# Patient Record
Sex: Female | Born: 1975 | Race: White | Hispanic: No | Marital: Married
Health system: Midwestern US, Community
[De-identification: ages and names within clinical notes are randomized; demographics above are authoritative.]

## PROBLEM LIST (undated history)

## (undated) DIAGNOSIS — N946 Dysmenorrhea, unspecified: Secondary | ICD-10-CM

## (undated) DIAGNOSIS — G8929 Other chronic pain: Secondary | ICD-10-CM

## (undated) DIAGNOSIS — M23305 Other meniscus derangements, unspecified medial meniscus, unspecified knee: Secondary | ICD-10-CM

## (undated) DIAGNOSIS — Z8639 Personal history of other endocrine, nutritional and metabolic disease: Secondary | ICD-10-CM

## (undated) DIAGNOSIS — L709 Acne, unspecified: Secondary | ICD-10-CM

## (undated) DIAGNOSIS — F419 Anxiety disorder, unspecified: Secondary | ICD-10-CM

## (undated) DIAGNOSIS — K8020 Calculus of gallbladder without cholecystitis without obstruction: Secondary | ICD-10-CM

## (undated) DIAGNOSIS — E785 Hyperlipidemia, unspecified: Secondary | ICD-10-CM

## (undated) DIAGNOSIS — I10 Essential (primary) hypertension: Secondary | ICD-10-CM

## (undated) DIAGNOSIS — Z9989 Dependence on other enabling machines and devices: Secondary | ICD-10-CM

## (undated) DIAGNOSIS — K219 Gastro-esophageal reflux disease without esophagitis: Secondary | ICD-10-CM

## (undated) DIAGNOSIS — E669 Obesity, unspecified: Secondary | ICD-10-CM

## (undated) DIAGNOSIS — E079 Disorder of thyroid, unspecified: Secondary | ICD-10-CM

## (undated) DIAGNOSIS — R7303 Prediabetes: Secondary | ICD-10-CM

## (undated) DIAGNOSIS — Z973 Presence of spectacles and contact lenses: Secondary | ICD-10-CM

## (undated) DIAGNOSIS — Z87898 Personal history of other specified conditions: Secondary | ICD-10-CM

## (undated) DIAGNOSIS — G4730 Sleep apnea, unspecified: Secondary | ICD-10-CM

## (undated) DIAGNOSIS — G4733 Obstructive sleep apnea (adult) (pediatric): Secondary | ICD-10-CM

## (undated) DIAGNOSIS — T7840XA Allergy, unspecified, initial encounter: Secondary | ICD-10-CM

## (undated) HISTORY — PX: SHOULDER ARTHROSCOPY: SHX128

## (undated) HISTORY — PX: CHOLECYSTECTOMY: SHX55

## (undated) HISTORY — DX: Sleep apnea, unspecified: G47.30

## (undated) HISTORY — DX: Anxiety disorder, unspecified: F41.9

## (undated) HISTORY — DX: Obesity, unspecified: E66.9

## (undated) HISTORY — PX: ELBOW SURGERY: SHX618

## (undated) HISTORY — DX: Essential (primary) hypertension: I10

## (undated) HISTORY — DX: Allergy, unspecified, initial encounter: T78.40XA

## (undated) HISTORY — DX: Obstructive sleep apnea (adult) (pediatric): G47.33

## (undated) HISTORY — DX: Hyperlipidemia, unspecified: E78.5

## (undated) HISTORY — DX: Gastro-esophageal reflux disease without esophagitis: K21.9

## (undated) HISTORY — PX: SHOULDER SURGERY: SHX246

## (undated) HISTORY — DX: Prediabetes: R73.03

## (undated) HISTORY — DX: Disorder of thyroid, unspecified: E07.9

## (undated) HISTORY — DX: Calculus of gallbladder without cholecystitis without obstruction: K80.20

## (undated) HISTORY — DX: Dependence on other enabling machines and devices: Z99.89

---

## 1999-09-17 HISTORY — PX: BILATERAL CARPAL TUNNEL RELEASE: SHX6508

## 2002-09-16 HISTORY — PX: CHOLECYSTECTOMY: SHX55

## 2005-08-20 ENCOUNTER — Encounter: Admission: RE | Admit: 2005-08-20 | Discharge: 2005-08-20 | Payer: Self-pay | Admitting: Family Medicine

## 2006-01-04 ENCOUNTER — Emergency Department (HOSPITAL_COMMUNITY): Admission: EM | Admit: 2006-01-04 | Discharge: 2006-01-04 | Payer: Self-pay | Admitting: Emergency Medicine

## 2006-03-06 ENCOUNTER — Emergency Department (HOSPITAL_COMMUNITY): Admission: EM | Admit: 2006-03-06 | Discharge: 2006-03-06 | Payer: Self-pay | Admitting: Emergency Medicine

## 2006-03-09 ENCOUNTER — Emergency Department (HOSPITAL_COMMUNITY): Admission: EM | Admit: 2006-03-09 | Discharge: 2006-03-09 | Payer: Self-pay | Admitting: Emergency Medicine

## 2007-09-16 IMAGING — CR DG CHEST 2V
2 series · 2 of 2 positions shown · non-contrast
Comparison: 03/06/2006

CLINICAL DATA: Chest pain

CHEST - 2 VIEW:

[w chest pa]
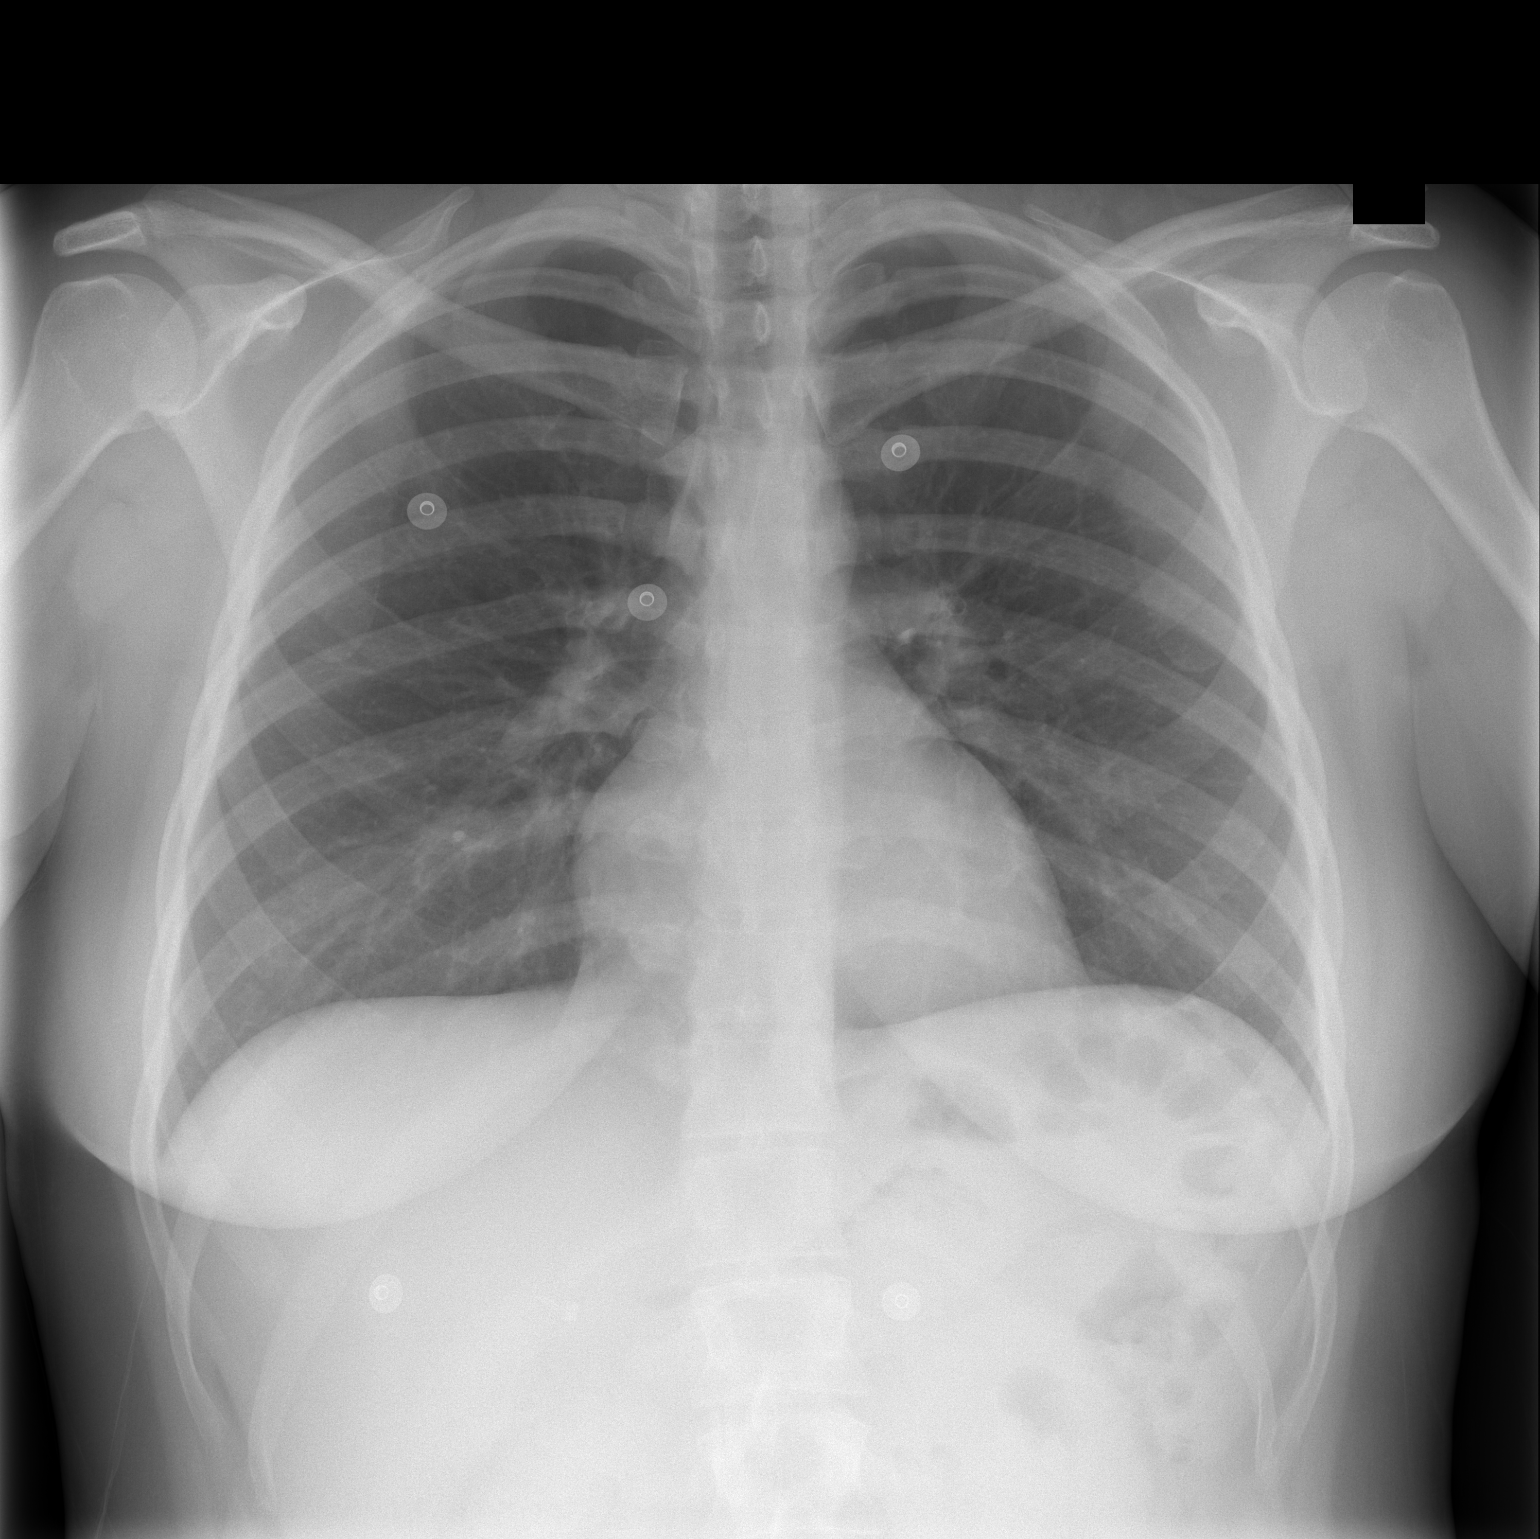

[w chest lat]
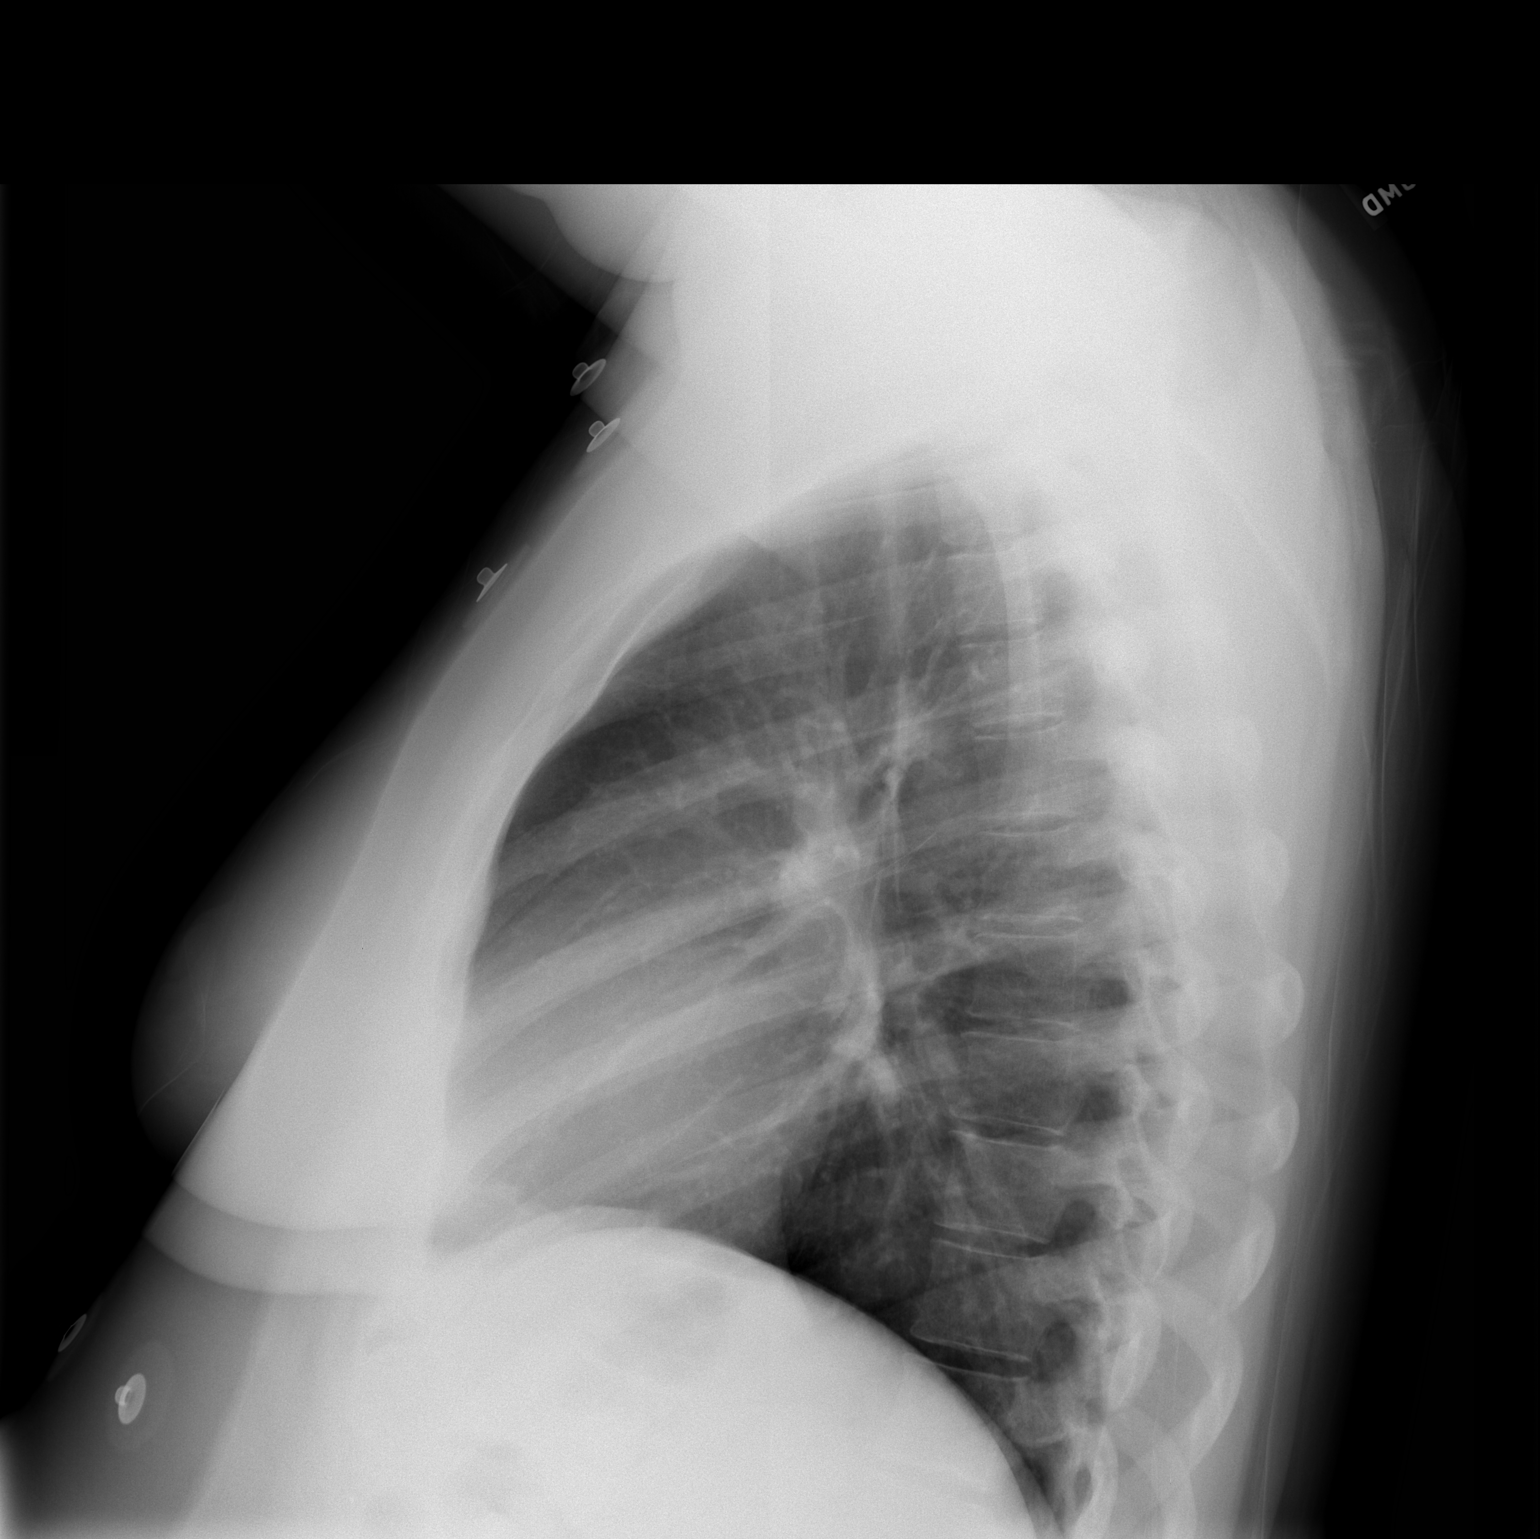

[2 of 2 positions shown; findings below may reference images not displayed]

FINDINGS: The heart size and mediastinal contours are within normal limits. 
Both lungs are clear.  The visualized skeletal structures are unremarkable.
IMPRESSION: No active cardiopulmonary disease

## 2011-09-17 HISTORY — PX: OTHER SURGICAL HISTORY: SHX169

## 2013-09-27 ENCOUNTER — Inpatient Hospital Stay: Payer: PRIVATE HEALTH INSURANCE

## 2013-09-27 LAB — HCG URINE, QL. - POC: Pregnancy test,urine (POC): NEGATIVE

## 2013-09-27 MED ADMIN — bupivacaine 0.25% -EPINEPHrine 1:200,000 (SENSORCAINE) 0.25 %-1:200,000 injection: @ 16:00:00 | NDC 00409174630

## 2013-09-27 MED ADMIN — oxyCODONE-acetaminophen (PERCOCET) 5-325 mg per tablet 1-2 tablet: ORAL | @ 18:00:00 | NDC 68084035511

## 2013-09-27 MED ADMIN — fentaNYL citrate (PF) injection 25 mcg: INTRAVENOUS | @ 16:00:00 | NDC 00641602701

## 2013-09-27 MED ADMIN — lactated ringers infusion: INTRAVENOUS | @ 14:00:00 | NDC 00409795309

## 2013-09-27 MED ADMIN — promethazine (PHENERGAN) injection 12.5 mg: INTRAMUSCULAR | @ 18:00:00 | NDC 00641092821

## 2013-09-27 MED ADMIN — ceFAZolin (ANCEF) 3 g in 0.9% NS 100 ml IVPB: INTRAVENOUS | @ 15:00:00 | NDC 09999051801

## 2013-09-27 MED ADMIN — famotidine (PEPCID) tablet 20 mg: ORAL | @ 14:00:00 | NDC 68001024000

## 2013-09-27 MED ADMIN — lactated ringers 3,000 mL Irrigation: @ 16:00:00 | NDC 00409795309

## 2013-09-27 NOTE — H&P (Signed)
H&P Update:  [name] [name] was seen and examined.  History and physical has been reviewed. There have been no significant clinical changes since the completion of the originally dated History and Physical.  Patient identified by surgeon; surgical site was confirmed by patient and surgeon.    Heart rrr no mgr  Chest CTAB    Proceed with surgery    Signed By: Stephani PoliceBradley T Darsh Vandevoort, MD     September 27, 2013 10:12 AM

## 2013-09-27 NOTE — Brief Op Note (Signed)
BRIEF OPERATIVE NOTE    Date of Procedure: 09/27/2013   Preoperative Diagnosis: right knee medial meniscus tear  Postoperative Diagnosis: right knee medial meniscus tear and extensive synovitis  Procedure(s):  right KNEE ARTHROSCOPY with medial meniscectomy and Extensive synovectomy   Surgeon(s) and Role:     * Stephani PoliceBradley T Delson Dulworth, MD - Primary  Anesthesia: General and local  Estimated Blood Loss: minimal  Specimens: none  Findings: MMT synovectomy plica [phone]  Complications: none  Implants: none

## 2013-09-27 NOTE — Progress Notes (Signed)
Post-Anesthesia Evaluation & Assessment    Visit Vitals   Item Reading   ??? BP 139/74   ??? Pulse 78   ??? Temp 97.5 ??F (36.4 ??C)   ??? Resp 12   ??? Ht '5\' 7"'  (1.702 m)   ??? Wt 91.428 kg (201 lb 9 oz)   ??? BMI 31.56 kg/m2   ??? SpO2 93%       Nausea/Vomiting controlled    Post-operative hydration adequate.    Mental status & Level of consciousness: alert and oriented x 3    Neurological status: moves all extremities, sensation grossly intact    Pulmonary status: airway patent, no supplemental oxygen required    Complications related to anesthesia: none    Patient has met all discharge requirements.    Additional comments:        Dixie Dials, MD

## 2013-09-27 NOTE — Op Note (Signed)
[facility]                  [address], [place], [place]  [id number]                                 OPERATIVE REPORT    [name] [name]:     [name], [name]  MRN              [id number]   DATE:       09/27/2013  BILLING:         [id number]  LOCATION:  ATTENDING:   Stephani PoliceBradley T Cadee Agro, MD  SURGEON:     Stephani PoliceBradley T Maija Biggers, MD      PREOPERATIVE DIAGNOSIS: Right knee medial meniscus tear.    POSTOPERATIVE DIAGNOSES  1. Right knee medial meniscus tear.  2. Extensive synovitis with notable synovial folds of the patellofemoral  joint.    PROCEDURES PERFORMED  1. Right knee arthroscopy with medial meniscectomy.  2. Extensive synovectomy.    SURGEON: Roseanna RainbowAlexander M. Rhiannon Sassaman, MD.    ANESTHESIA: General with local Marcaine.    ESTIMATED BLOOD LOSS: Minimal.    SPECIMENS REMOVED: None.    COMPLICATIONS: None.    IMPLANTS: None.    INDICATIONS FOR PROCEDURE: The patient is a pleasant 38 year old white  female with history of persistent medial knee pain. She has had persistent  mechanical symptoms, found to have a medial meniscus tear as well as  thickening of the synovium. She was brought to the operative suite for  definitive management. Full informed consent was obtained. Risks, benefits  explained to her in full including, but not limited to bleeding, infection,  nerve damage, pain, stiffness, swelling, further surgery, revision surgery  and complete resolution of symptoms.    DESCRIPTION OF PROCEDURE: The patient was brought to the operating suite,  placed on the table in supine position. She was intubated with general  endotracheal anesthesia. We proceeded to prep and drape her right leg in a  normal sterile fashion, secured in the arthroscopic leg holder. Total  tourniquet time was 32 minutes after inflation at 250 mmHg. We proceeded to  make a standard inferolateral portal, inferomedial portal was made under  direct visualization with a spinal needle. We proceeded to explore  the knee  with the following findings.    FINDINGS: Suprapatellar pouch had notable synovitis, both superior lateral  and superior medial aspect of the knee, extending down the medial aspect of  the knee. Abrasion of the medial condyle was appreciated by a  large synovial fold consistent with a plica. Medial and lateral gutters had  some moderate synovitis as well. Medial compartment was noted to have a  complex undersurface meniscal tear. Subluxation of the meniscal fragment  was appreciated into the joint with examination, utilizing a hooked probe.  Notable instability of the fragment was appreciated. This was essentially a  near full-thickness undersurface oblique tear of the posterior horn of the  meniscus. This did not extend into red-red zone essentially within the  white-white zone and more superior to the red-white zone posteriorly. The  root was found to be intact. ACL and PCL were intact. Lateral compartment  was grossly unremarkable.    We then proceeded to turn our attention to the medial compartment. Under  direct visualization, we proceeded to perform a morselization of the medial  meniscus. This was contoured down to a stable base. The  meniscus was found  to be further probing and the tear persisted and was found to be more  horizontal as we proceeded more posterior. As such, a morcellation was  taken to the border of the red-white zone. Approximately one-quarter of the  meniscus was morselized of the entire quarter of the meniscus. The root was  left intact as well as the general circumferential fibers of the meniscus  peripherally. After this was done, we proceeded to utilize a shaver to  debride this down to a stable base. Adequate saucerization was appreciated  with no residual instability, utilizing the meniscal shaver. Adequate  contouring was appreciated from the lateral portal and medial portal.    We proceeded to turn our attention to the remainder of the knee.  Synovectomy was done utilizing  the shaver starting the superior  patellofemoral pouch, extending both medially and laterally as well as into  the anterior medial aspect of the knee. As such, a notable synovium was  resected. A small abrasion over the femoral condyle was appreciated with a  mild chondroplasty of this area. We proceeded to copiously irrigate the  knee. All the instruments were removed from the knee. The incisions were  closed with simple nylon stitch. Dry sterile dressing was placed.  Tourniquet was taken down.                      Stephani Police, MD    BTB:wmx  D: 09/27/2013 10:58 A T: 09/27/2013 06:58 P  Job#:  [id number]  CScriptDoc #:  [id number]  cc:   Stephani Police, MD

## 2013-09-27 NOTE — Other (Signed)
1058 Patient received from OR, connected to monitor. Assessment complete

## 2013-09-27 NOTE — Op Note (Signed)
[facility]                  [address], [place], [place]  [address]                                 OPERATIVE REPORT    PATIENT:     [name], [name]  MRN              [id number]   DATE:       09/27/2013  BILLING:         [id number]  LOCATION:  ATTENDING:   Sigurd Pugh T Randolf Sansoucie, MD  SURGEON:     Auren Valdes T Denyla Cortese, MD      PREOPERATIVE DIAGNOSIS: Right knee medial meniscus tear.    POSTOPERATIVE DIAGNOSES  1. Right knee medial meniscus tear.  2. Extensive synovitis with notable synovial folds of the patellofemoral  joint.    PROCEDURES PERFORMED  1. Right knee arthroscopy with medial meniscectomy.  2. Extensive synovectomy.    SURGEON: Alexander M. Ikaika Showers, MD.    ANESTHESIA: General with local Marcaine.    ESTIMATED BLOOD LOSS: Minimal.    SPECIMENS REMOVED: None.    COMPLICATIONS: None.    IMPLANTS: None.    INDICATIONS FOR PROCEDURE: The patient is a pleasant 37-year-old white  female with history of persistent medial knee pain. She has had persistent  mechanical symptoms, found to have a medial meniscus tear as well as  thickening of the synovium. She was brought to the operative suite for  definitive management. Full informed consent was obtained. Risks, benefits  explained to her in full including, but not limited to bleeding, infection,  nerve damage, pain, stiffness, swelling, further surgery, revision surgery  and complete resolution of symptoms.    DESCRIPTION OF PROCEDURE: The patient was brought to the operating suite,  placed on the table in supine position. She was intubated with general  endotracheal anesthesia. We proceeded to prep and drape her right leg in a  normal sterile fashion, secured in the arthroscopic leg holder. Total  tourniquet time was 32 minutes after inflation at 250 mmHg. We proceeded to  make a standard inferolateral portal, inferomedial portal was made under  direct visualization with a spinal needle. We proceeded to explore  the knee  with the following findings.    FINDINGS: Suprapatellar pouch had notable synovitis, both superior lateral  and superior medial aspect of the knee, extending down the medial aspect of  the knee. Abrasion of the medial condyle was appreciated by a  large synovial fold consistent with a plica. Medial and lateral gutters had  some moderate synovitis as well. Medial compartment was noted to have a  complex undersurface meniscal tear. Subluxation of the meniscal fragment  was appreciated into the joint with examination, utilizing a hooked probe.  Notable instability of the fragment was appreciated. This was essentially a  near full-thickness undersurface oblique tear of the posterior horn of the  meniscus. This did not extend into red-red zone essentially within the  white-white zone and more superior to the red-white zone posteriorly. The  root was found to be intact. ACL and PCL were intact. Lateral compartment  was grossly unremarkable.    We then proceeded to turn our attention to the medial compartment. Under  direct visualization, we proceeded to perform a morselization of the medial  meniscus. This was contoured down to a stable base. The   meniscus was found  to be further probing and the tear persisted and was found to be more  horizontal as we proceeded more posterior. As such, a morcellation was  taken to the border of the red-white zone. Approximately one-quarter of the  meniscus was morselized of the entire quarter of the meniscus. The root was  left intact as well as the general circumferential fibers of the meniscus  peripherally. After this was done, we proceeded to utilize a shaver to  debride this down to a stable base. Adequate saucerization was appreciated  with no residual instability, utilizing the meniscal shaver. Adequate  contouring was appreciated from the lateral portal and medial portal.    We proceeded to turn our attention to the remainder of the knee.  Synovectomy was done utilizing  the shaver starting the superior  patellofemoral pouch, extending both medially and laterally as well as into  the anterior medial aspect of the knee. As such, a notable synovium was  resected. A small abrasion over the femoral condyle was appreciated with a  mild chondroplasty of this area. We proceeded to copiously irrigate the  knee. All the instruments were removed from the knee. The incisions were  closed with simple nylon stitch. Dry sterile dressing was placed.  Tourniquet was taken down.                      Ermal Haberer T Collie Wernick, MD    BTB:wmx  D: 09/27/2013 10:58 A T: 09/27/2013 06:58 P  Job#:  [id number]  CScriptDoc #:  [id number]  cc:   Corian Handley T Amairany Schumpert, MD

## 2013-09-28 MED FILL — KETOROLAC TROMETHAMINE 30 MG/ML INJECTION: 30 mg/mL (1 mL) | INTRAMUSCULAR | Qty: 1

## 2013-09-28 MED FILL — ONDANSETRON (PF) 4 MG/2 ML INJECTION: 4 mg/2 mL | INTRAMUSCULAR | Qty: 2

## 2013-09-28 MED FILL — CEFAZOLIN 1 GRAM SOLUTION FOR INJECTION: 1 gram | INTRAMUSCULAR | Qty: 3

## 2013-09-28 MED FILL — LACTATED RINGERS IV: INTRAVENOUS | Qty: 300

## 2013-09-28 MED FILL — DEXAMETHASONE SODIUM PHOSPHATE 4 MG/ML IJ SOLN: 4 mg/mL | INTRAMUSCULAR | Qty: 1

## 2013-09-28 MED FILL — LIDOCAINE (PF) 20 MG/ML (2 %) IV SYRINGE: 100 mg/5 mL (2 %) | INTRAVENOUS | Qty: 5

## 2015-09-17 HISTORY — PX: PARTIAL HYSTERECTOMY: SHX80

## 2016-09-27 ENCOUNTER — Inpatient Hospital Stay: Admit: 2016-09-27 | Payer: BLUE CROSS/BLUE SHIELD | Primary: Internal Medicine

## 2016-10-01 NOTE — Anesthesia Pre-Procedure Evaluation (Addendum)
Anesthetic History               Review of Systems / Medical History  Patient summary reviewed, nursing notes reviewed and pertinent labs reviewed    Pulmonary        Sleep apnea: CPAP           Neuro/Psych         Psychiatric history     Cardiovascular              Hyperlipidemia         GI/Hepatic/Renal     GERD           Endo/Other      Hypothyroidism       Other Findings            Physical Exam    Airway  Mallampati: II  TM Distance: > 6 cm  Neck ROM: normal range of motion   Mouth opening: Normal     Cardiovascular    Rhythm: regular  Rate: normal         Dental  No notable dental hx       Pulmonary  Breath sounds clear to auscultation               Abdominal         Other Findings            Anesthetic Plan    ASA: 3  Anesthesia type: general          Induction: Intravenous  Anesthetic plan and risks discussed with: Patient

## 2016-10-02 ENCOUNTER — Inpatient Hospital Stay: Payer: BLUE CROSS/BLUE SHIELD

## 2016-10-02 ENCOUNTER — Encounter: Primary: Internal Medicine

## 2016-10-02 MED ORDER — PROPOFOL 10 MG/ML IV EMUL
10 mg/mL | INTRAVENOUS | Status: AC
Start: 2016-10-02 — End: ?

## 2016-10-02 MED ORDER — ONDANSETRON (PF) 4 MG/2 ML INJECTION
4 mg/2 mL | INTRAMUSCULAR | Status: DC | PRN
Start: 2016-10-02 — End: 2016-10-02
  Administered 2016-10-02: 17:00:00 via INTRAVENOUS

## 2016-10-02 MED ORDER — FLUMAZENIL 0.1 MG/ML IV SOLN
0.1 mg/mL | INTRAVENOUS | Status: DC | PRN
Start: 2016-10-02 — End: 2016-10-02

## 2016-10-02 MED ORDER — ROCURONIUM 10 MG/ML IV
10 mg/mL | INTRAVENOUS | Status: AC
Start: 2016-10-02 — End: ?

## 2016-10-02 MED ORDER — DEXAMETHASONE SODIUM PHOSPHATE 4 MG/ML IJ SOLN
4 mg/mL | INTRAMUSCULAR | Status: AC
Start: 2016-10-02 — End: ?

## 2016-10-02 MED ORDER — GUM MASTIC-STORAX-METHYLSALICYLATE-ALCOHOL IN A DROPPERETTE
Status: AC
Start: 2016-10-02 — End: ?

## 2016-10-02 MED ORDER — LACTATED RINGERS IV
INTRAVENOUS | Status: DC
Start: 2016-10-02 — End: 2016-10-02
  Administered 2016-10-02: 16:00:00 via INTRAVENOUS

## 2016-10-02 MED ORDER — NALOXONE 0.4 MG/ML INJECTION
0.4 mg/mL | INTRAMUSCULAR | Status: DC | PRN
Start: 2016-10-02 — End: 2016-10-02

## 2016-10-02 MED ORDER — ONDANSETRON (PF) 4 MG/2 ML INJECTION
4 mg/2 mL | INTRAMUSCULAR | Status: AC
Start: 2016-10-02 — End: ?

## 2016-10-02 MED ORDER — FENTANYL CITRATE (PF) 50 MCG/ML IJ SOLN
50 mcg/mL | INTRAMUSCULAR | Status: DC | PRN
Start: 2016-10-02 — End: 2016-10-02
  Administered 2016-10-02: 17:00:00 via INTRAVENOUS

## 2016-10-02 MED ORDER — PROMETHAZINE IN NS 6.25 MG/50 ML IV PIGGY BAG
6.25 mg/50 ml | INTRAVENOUS | Status: DC | PRN
Start: 2016-10-02 — End: 2016-10-02

## 2016-10-02 MED ORDER — SUGAMMADEX 100 MG/ML INTRAVENOUS SOLUTION
100 mg/mL | INTRAVENOUS | Status: AC
Start: 2016-10-02 — End: ?

## 2016-10-02 MED ORDER — MIDAZOLAM 1 MG/ML IJ SOLN
1 mg/mL | INTRAMUSCULAR | Status: DC | PRN
Start: 2016-10-02 — End: 2016-10-02
  Administered 2016-10-02: 17:00:00 via INTRAVENOUS

## 2016-10-02 MED ORDER — BUPIVACAINE (PF) 0.25 % (2.5 MG/ML) IJ SOLN
0.25 % (2.5 mg/mL) | INTRAMUSCULAR | Status: AC
Start: 2016-10-02 — End: ?

## 2016-10-02 MED ORDER — SUGAMMADEX 100 MG/ML INTRAVENOUS SOLUTION
100 mg/mL | INTRAVENOUS | Status: DC | PRN
Start: 2016-10-02 — End: 2016-10-02
  Administered 2016-10-02: 17:00:00 via INTRAVENOUS

## 2016-10-02 MED ORDER — FENTANYL CITRATE (PF) 50 MCG/ML IJ SOLN
50 mcg/mL | INTRAMUSCULAR | Status: DC | PRN
Start: 2016-10-02 — End: 2016-10-02

## 2016-10-02 MED ORDER — MIDAZOLAM 1 MG/ML IJ SOLN
1 mg/mL | INTRAMUSCULAR | Status: AC
Start: 2016-10-02 — End: ?

## 2016-10-02 MED ORDER — DEXAMETHASONE SODIUM PHOSPHATE 4 MG/ML IJ SOLN
4 mg/mL | INTRAMUSCULAR | Status: DC | PRN
Start: 2016-10-02 — End: 2016-10-02
  Administered 2016-10-02: 17:00:00 via INTRAVENOUS

## 2016-10-02 MED ORDER — ROCURONIUM 10 MG/ML IV
10 mg/mL | INTRAVENOUS | Status: DC | PRN
Start: 2016-10-02 — End: 2016-10-02
  Administered 2016-10-02: 17:00:00 via INTRAVENOUS

## 2016-10-02 MED ORDER — LIDOCAINE (PF) 20 MG/ML (2 %) IJ SOLN
20 mg/mL (2 %) | INTRAMUSCULAR | Status: DC | PRN
Start: 2016-10-02 — End: 2016-10-02
  Administered 2016-10-02: 17:00:00 via INTRAVENOUS

## 2016-10-02 MED ORDER — FENTANYL CITRATE (PF) 50 MCG/ML IJ SOLN
50 mcg/mL | INTRAMUSCULAR | Status: AC
Start: 2016-10-02 — End: ?

## 2016-10-02 MED ORDER — LIDOCAINE HCL 1 % (10 MG/ML) IJ SOLN
10 mg/mL (1 %) | Freq: Once | INTRAMUSCULAR | Status: AC | PRN
Start: 2016-10-02 — End: 2016-10-02
  Administered 2016-10-02: 16:00:00 via INTRADERMAL

## 2016-10-02 MED ORDER — PROPOFOL 10 MG/ML IV EMUL
10 mg/mL | INTRAVENOUS | Status: DC | PRN
Start: 2016-10-02 — End: 2016-10-02
  Administered 2016-10-02: 17:00:00 via INTRAVENOUS

## 2016-10-02 MED ORDER — SODIUM CHLORIDE 0.9 % IJ SYRG
INTRAMUSCULAR | Status: DC | PRN
Start: 2016-10-02 — End: 2016-10-02

## 2016-10-02 MED ORDER — PROMETHAZINE 25 MG/ML INJECTION
25 mg/mL | INTRAMUSCULAR | Status: DC | PRN
Start: 2016-10-02 — End: 2016-10-02

## 2016-10-02 MED ORDER — BUPIVACAINE (PF) 0.25 % (2.5 MG/ML) IJ SOLN
0.25 % (2.5 mg/mL) | INTRAMUSCULAR | Status: DC | PRN
Start: 2016-10-02 — End: 2016-10-02
  Administered 2016-10-02: 17:00:00

## 2016-10-02 MED ORDER — HYDROMORPHONE (PF) 1 MG/ML IJ SOLN
1 mg/mL | INTRAMUSCULAR | Status: DC | PRN
Start: 2016-10-02 — End: 2016-10-02
  Administered 2016-10-02: 19:00:00 via INTRAVENOUS

## 2016-10-02 MED FILL — DIPRIVAN 10 MG/ML INTRAVENOUS EMULSION: 10 mg/mL | INTRAVENOUS | Qty: 20

## 2016-10-02 MED FILL — FENTANYL CITRATE (PF) 50 MCG/ML IJ SOLN: 50 mcg/mL | INTRAMUSCULAR | Qty: 2

## 2016-10-02 MED FILL — GUM MASTIC-STORAX-METHYLSALICYLATE-ALCOHOL IN A DROPPERETTE: Qty: 1

## 2016-10-02 MED FILL — HYDROMORPHONE (PF) 1 MG/ML IJ SOLN: 1 mg/mL | INTRAMUSCULAR | Qty: 1

## 2016-10-02 MED FILL — BRIDION 100 MG/ML INTRAVENOUS SOLUTION: 100 mg/mL | INTRAVENOUS | Qty: 2

## 2016-10-02 MED FILL — MIDAZOLAM 1 MG/ML IJ SOLN: 1 mg/mL | INTRAMUSCULAR | Qty: 2

## 2016-10-02 MED FILL — BUPIVACAINE (PF) 0.25 % (2.5 MG/ML) IJ SOLN: 0.25 % (2.5 mg/mL) | INTRAMUSCULAR | Qty: 30

## 2016-10-02 MED FILL — ROCURONIUM 10 MG/ML IV: 10 mg/mL | INTRAVENOUS | Qty: 5

## 2016-10-02 MED FILL — DEXAMETHASONE SODIUM PHOSPHATE 4 MG/ML IJ SOLN: 4 mg/mL | INTRAMUSCULAR | Qty: 5

## 2016-10-02 MED FILL — ONDANSETRON (PF) 4 MG/2 ML INJECTION: 4 mg/2 mL | INTRAMUSCULAR | Qty: 2

## 2016-10-02 MED FILL — LACTATED RINGERS IV: INTRAVENOUS | Qty: 1000

## 2016-10-02 NOTE — Anesthesia Post-Procedure Evaluation (Signed)
Post-Anesthesia Evaluation and Assessment    Patient: [name] BellowsLindsay D Chiriboga MRN: [id number]  SSN: [id number]    Date of Birth: Jul 04, 1976  Age: 41 y.o.  Sex: female       Cardiovascular Function/Vital Signs  Visit Vitals   ??? BP 109/78   ??? Pulse (!) 101   ??? Temp 36.5 ??C (97.7 ??F)   ??? Resp 25   ??? Ht 5\' 6"  (1.676 m)   ??? Wt 92.4 kg (203 lb 11.3 oz)   ??? SpO2 99%   ??? BMI 32.88 kg/m2       Patient is status post general anesthesia for Procedure(s):  DIAGNOSTIC LAPAROSCOPY .    Nausea/Vomiting: None    Postoperative hydration reviewed and adequate.    Pain:  Pain Scale 1: Numeric (0 - 10) (10/02/16 1259)  Pain Intensity 1: 0 (10/02/16 1259)   Managed    Neurological Status:   Neuro (WDL): Within Defined Limits (10/02/16 1243)  Neuro  Neurologic State: Drowsy (10/02/16 1243)   At baseline    Mental Status and Level of Consciousness: Arousable    Pulmonary Status:   O2 Device: Oxygen mask (10/02/16 1253)   Adequate oxygenation and airway patent    Complications related to anesthesia: None    Post-anesthesia assessment completed. No concerns    Signed By: Angelique Blondeross A Meghan Warshawsky, MD     October 02, 2016

## 2016-10-02 NOTE — Op Note (Signed)
[facility]  Operation Report  NAME:  [name] LemonRITCHARD, [name]  SEX:   F  DATE: 10/02/2016  DOB: April 05, 1976  MR#    [id number]  ROOM:  PL  ACCT#  [id number]        PREOPERATIVE DIAGNOSIS:  Chronic pelvic pain and right ovarian cyst on ultrasound.    POSTOPERATIVE DIAGNOSIS:  Chronic pelvic pain and right ovarian cyst on ultrasound.    PROCEDURE:  Diagnostic laparoscopy.      SURGEON:   Melvern Sampleachel Makalah Asberry, MD    ASSISTANT:  Burnadette PeterKelly Bailey    ANESTHESIA:  General.    ESTIMATED BLOOD LOSS:  Minimal.    FINDINGS:  Normal appearing uterus.  Normal appearing bilateral tubes and ovaries.  No evidence of ovarian cyst.  Normal appearing appendix and liver.    SPECIMENS:  None.    COMPLICATIONS:  None.    DESCRIPTION OF PROCEDURE:  After risks and benefits of the procedure were discussed in detail with the patient, consent was signed and on chart.  She went to the operating room where she was placed under general anesthesia.  She was prepped and draped in normal sterile fashion in dorsal lithotomy position.  Her bladder was drained with a red rubber catheter.  A sponge stick was placed in the patient's vagina.  An #11 blade scalpel was used to make an incision at the umbilicus.  A Veress needle was inserted and pneumoperitoneum created without difficulty.  A 5 mm Optiview trocar was then inserted and the abdomen entered under direct visualization.  The patient was placed in Trendelenburg.  A 5 mm trocar was then placed in both the right and the left lower quadrants under direct visualization after 0.25% Marcaine had been injected with a spinal needle.  Bowels were swept cephalad using a blunt probe.  The uterus was elevated.  The posterior cul-de-sac was evaluated and noted to be free of any adhesions or endometriosis.  Both tubes and ovaries were elevated.  There was no evidence of ovarian cysts.  The ovaries were normal in appearance.  The tubes were normal in appearance.  Pictures were taken.  The upper abdomen  was then evaluated and appeared to be normal.  The appendix was visualized and appeared to be normal.  Pictures again were taken.  Laparoscopic instruments were removed.  Pneumoperitoneum was reduced.  Trocars were removed.  Skin was closed with Monocryl.  Sponge, lap and needle counts were correct times 2.  The patient tolerated procedure well and went to the recovery room in stable condition.      ___________________  Clayton Biblesachel D Malia Corsi MD  Dictated By:.   JMB  D:10/02/2016 12:47:56  T: 10/02/2016 13:24:58  [id number]

## 2016-10-02 NOTE — Other (Signed)
Pt wishes to have spouse at bedside, called to waiting room. At bedside

## 2016-10-02 NOTE — H&P (Signed)
Gynecology History and Physical    Name: [name] [name] MRN: [id number] SSN: [id number]    Date of Birth: Jul 11, 1976  Age: 41 y.o.  Sex: female       Subjective:      Chief complaint:  Chronic pelvic pain and Right Ovarian cyst     [name] [name] is a 41 y.o. Caucasian female with a history of chronic pelvic pain. Previous workup included Ultrasound which revealed a right ovarian cyst. Previous treatment measures included nonsteroidal anti-inflammatory drugs (NSAID???s) and observation. She is admitted for Procedure(s) (LRB):  LAPAROSCOPY RIGHT OVARIAN CYSTECTOMY (Right).    The current method of family planning is none.    OB History     No data available        Past Medical History:   Diagnosis Date   ??? Anxiety    ??? GERD (gastroesophageal reflux disease)    ??? High cholesterol    ??? Iron deficiency    ??? Ovarian cyst    ??? Pelvic pain    ??? PTSD (post-traumatic stress disorder)    ??? Thyroid disease    ??? Unspecified sleep apnea     CPAP     Past Surgical History:   Procedure Laterality Date   ??? HX CARPAL TUNNEL RELEASE Bilateral    ??? HX CHOLECYSTECTOMY     ??? HX SHOULDER ARTHROSCOPY Right     X2     Social History     Occupational History   ??? Not on file.     Social History Main Topics   ??? Smoking status: Never Smoker   ??? Smokeless tobacco: Never Used   ??? Alcohol use Yes      Comment: RARELY   ??? Drug use: No   ??? Sexual activity: Not on file     Family History   Problem Relation Age of Onset   ??? Heart Disease Father         Allergies   Allergen Reactions   ??? Sulfa (Sulfonamide Antibiotics) Rash     Prior to Admission medications    Medication Sig Start Date End Date Taking? Authorizing Provider   metFORMIN (GLUCOPHAGE) 1,000 mg tablet Take 1,000 mg by mouth two (2) times daily (with meals).   Yes Historical Provider   simvastatin (ZOCOR) 10 mg tablet Take 10 mg by mouth nightly.   Yes Historical Provider   busPIRone (BUSPAR) 15 mg tablet Take 15 mg by mouth three (3) times daily.   Yes Historical Provider    azelastine-fluticasone (DYMISTA) 137-50 mcg/spray spry 1 Spray by Nasal route two (2) times a day.   Yes Historical Provider   montelukast (SINGULAIR) 10 mg tablet Take 10 mg by mouth nightly.   Yes Historical Provider   spironolactone (ALDACTONE) 100 mg tablet Take 100 mg by mouth daily.   Yes Historical Provider   ergocalciferol (VITAMIN D2) 50,000 unit capsule Take 50,000 Units by mouth every seven (7) days.   Yes Historical Provider   ALPRAZolam (XANAX) 0.25 mg tablet Take 0.25 mg by mouth nightly as needed for Anxiety.   Yes Historical Provider   Cetirizine (ZYRTEC) 10 mg cap Take 1 Cap by mouth nightly.   Yes Historical Provider   levothyroxine (SYNTHROID) 50 mcg tablet Take 50 mcg by mouth Daily (before breakfast).   Yes Historical Provider   pantoprazole (PROTONIX) 20 mg tablet Take 40 mg by mouth nightly.   Yes Historical Provider   oxyCODONE-acetaminophen (PERCOCET) 5-325 mg per tablet Take 1-2 tablets by mouth  every four (4) hours as needed for Pain. 09/27/13   Zara Council, MD   Ferrous Sulfate (FLOW FE) 47.5 mg iron TbER tablet Take 1 tablet by mouth daily. 45MG     Historical Provider        Review of Systems:  Pertinent items are noted in the History of Present Illness.     Objective:     Vitals:    10/02/16 1010   BP: 119/86   Pulse: 94   Resp: 16   Temp: 98.8 ??F (37.1 ??C)   SpO2: 99%   Weight: 92.4 kg (203 lb 11.3 oz)   Height: 5\' 6"  (1.676 m)       Physical Exam:  Deferred    Assessment:     Active Problems:    * No active hospital problems. *     Chronic pelvic pain with right ovarian cyst     Plan:     Procedure(s) (LRB):  LAPAROSCOPY RIGHT OVARIAN CYSTECTOMY (Right)  Discussed the risks of surgery including the risks of bleeding, infection, deep vein thrombosis, and surgical injuries to internal organs including but not limited to the bowels, bladder, rectum, and female reproductive organs. The patient understands the risks; any and all questions were answered to the patient's satisfaction.     Signed By:  Clayton Bibles, MD     October 02, 2016

## 2016-10-02 NOTE — Other (Signed)
Went over [employer] instructions with pt and wife, copy given to wife, pt and wife state they have prescription at home already

## 2016-10-02 NOTE — Op Note (Signed)
[facility]  Operation Report  NAME:  [name] LemonRITCHARD, Bryanah  SEX:   F  DATE: 10/02/2016  DOB: 11-20-1975  MR#    [id number]  ROOM:  PL  ACCT#  [id number]        PREOPERATIVE DIAGNOSIS:  Chronic pelvic pain and right ovarian cyst on ultrasound.    POSTOPERATIVE DIAGNOSIS:  Chronic pelvic pain and right ovarian cyst on ultrasound.    PROCEDURE:  Diagnostic laparoscopy.      SURGEON:   Melvern Sampleachel Capone Schwinn, MD    ASSISTANT:  Burnadette PeterKelly Bailey    ANESTHESIA:  General.    ESTIMATED BLOOD LOSS:  Minimal.    FINDINGS:  Normal appearing uterus.  Normal appearing bilateral tubes and ovaries.  No evidence of ovarian cyst.  Normal appearing appendix and liver.    SPECIMENS:  None.    COMPLICATIONS:  None.    DESCRIPTION OF PROCEDURE:  After risks and benefits of the procedure were discussed in detail with the patient, consent was signed and on chart.  She went to the operating room where she was placed under general anesthesia.  She was prepped and draped in normal sterile fashion in dorsal lithotomy position.  Her bladder was drained with a red rubber catheter.  A sponge stick was placed in the patient's vagina.  An #11 blade scalpel was used to make an incision at the umbilicus.  A Veress needle was inserted and pneumoperitoneum created without difficulty.  A 5 mm Optiview trocar was then inserted and the abdomen entered under direct visualization.  The patient was placed in Trendelenburg.  A 5 mm trocar was then placed in both the right and the left lower quadrants under direct visualization after 0.25% Marcaine had been injected with a spinal needle.  Bowels were swept cephalad using a blunt probe.  The uterus was elevated.  The posterior cul-de-sac was evaluated and noted to be free of any adhesions or endometriosis.  Both tubes and ovaries were elevated.  There was no evidence of ovarian cysts.  The ovaries were normal in appearance.  The tubes were normal in appearance.  Pictures were taken.  The upper abdomen was then  evaluated and appeared to be normal.  The appendix was visualized and appeared to be normal.  Pictures again were taken.  Laparoscopic instruments were removed.  Pneumoperitoneum was reduced.  Trocars were removed.  Skin was closed with Monocryl.  Sponge, lap and needle counts were correct times 2.  The patient tolerated procedure well and went to the recovery room in stable condition.      ___________________  Clayton Biblesachel D Eutimio Gharibian MD  Dictated By:.   JMB  D:10/02/2016 12:47:56  T: 10/02/2016 13:24:58  [id number]

## 2016-10-07 MED FILL — DEXAMETHASONE SODIUM PHOSPHATE 4 MG/ML IJ SOLN: 4 mg/mL | INTRAMUSCULAR | Qty: 8

## 2016-10-07 MED FILL — ROCURONIUM 10 MG/ML IV: 10 mg/mL | INTRAVENOUS | Qty: 40

## 2016-10-07 MED FILL — XYLOCAINE-MPF 20 MG/ML (2 %) INJECTION SOLUTION: 20 mg/mL (2 %) | INTRAMUSCULAR | Qty: 100

## 2016-10-07 MED FILL — BRIDION 100 MG/ML INTRAVENOUS SOLUTION: 100 mg/mL | INTRAVENOUS | Qty: 185

## 2016-10-07 MED FILL — PROPOFOL 10 MG/ML IV EMUL: 10 mg/mL | INTRAVENOUS | Qty: 20

## 2016-10-07 MED FILL — ONDANSETRON (PF) 4 MG/2 ML INJECTION: 4 mg/2 mL | INTRAMUSCULAR | Qty: 4

## 2016-11-11 ENCOUNTER — Inpatient Hospital Stay: Admit: 2016-11-11 | Payer: BLUE CROSS/BLUE SHIELD | Primary: Internal Medicine

## 2016-11-11 DIAGNOSIS — Z01812 Encounter for preprocedural laboratory examination: Secondary | ICD-10-CM

## 2016-11-11 LAB — ANTIBODY SCREEN: Antibody screen: NEGATIVE

## 2016-11-11 LAB — BLOOD TYPE, (ABO+RH)
ABO/Rh(D): A POS
ABO/Rh: A POS

## 2016-11-20 ENCOUNTER — Inpatient Hospital Stay: Payer: BLUE CROSS/BLUE SHIELD

## 2016-11-20 LAB — GLUCOSE, POC: Glucose (POC): 103 mg/dL (ref 65–105)

## 2016-11-20 MED ORDER — SODIUM CHLORIDE 0.9 % IJ SYRG
INTRAMUSCULAR | Status: DC | PRN
Start: 2016-11-20 — End: 2016-11-20

## 2016-11-20 MED ORDER — BUPIVACAINE-EPINEPHRINE (PF) 0.25 %-1:200,000 IJ SOLN
0.25 %-1:200,000 | INTRAMUSCULAR | Status: DC | PRN
Start: 2016-11-20 — End: 2016-11-20
  Administered 2016-11-20: 14:00:00 via SUBCUTANEOUS

## 2016-11-20 MED ORDER — NALOXONE 0.4 MG/ML INJECTION
0.4 mg/mL | INTRAMUSCULAR | Status: DC | PRN
Start: 2016-11-20 — End: 2016-11-21

## 2016-11-20 MED ORDER — LACTATED RINGERS IV
INTRAVENOUS | Status: DC | PRN
Start: 2016-11-20 — End: 2016-11-20
  Administered 2016-11-20: 13:00:00 via INTRAVENOUS

## 2016-11-20 MED ORDER — SODIUM CHLORIDE 0.9 % IJ SYRG
Freq: Three times a day (TID) | INTRAMUSCULAR | Status: DC
Start: 2016-11-20 — End: 2016-11-21
  Administered 2016-11-20 – 2016-11-21 (×3): via INTRAVENOUS

## 2016-11-20 MED ORDER — LIDOCAINE (PF) 5 MG/ML (0.5 %) IJ SOLN
5 mg/mL (0. %) | INTRAMUSCULAR | Status: DC | PRN
Start: 2016-11-20 — End: 2016-11-20
  Administered 2016-11-20 (×2): via INTRAVENOUS

## 2016-11-20 MED ORDER — ONDANSETRON (PF) 4 MG/2 ML INJECTION
4 mg/2 mL | Freq: Four times a day (QID) | INTRAMUSCULAR | Status: DC | PRN
Start: 2016-11-20 — End: 2016-11-21

## 2016-11-20 MED ORDER — MORPHINE 4 MG/ML INTRAVENOUS SOLUTION
4 mg/mL | INTRAVENOUS | Status: AC
Start: 2016-11-20 — End: 2016-11-20
  Administered 2016-11-20: 16:00:00 via INTRAVENOUS

## 2016-11-20 MED ORDER — LIDOCAINE (PF) 20 MG/ML (2 %) IJ SOLN
20 mg/mL (2 %) | INTRAMUSCULAR | Status: DC | PRN
Start: 2016-11-20 — End: 2016-11-20
  Administered 2016-11-20: 13:00:00 via INTRAVENOUS

## 2016-11-20 MED ORDER — MIDAZOLAM 1 MG/ML IJ SOLN
1 mg/mL | INTRAMUSCULAR | Status: DC | PRN
Start: 2016-11-20 — End: 2016-11-20
  Administered 2016-11-20: 13:00:00 via INTRAVENOUS

## 2016-11-20 MED ORDER — ROCURONIUM 10 MG/ML IV
10 mg/mL | INTRAVENOUS | Status: DC | PRN
Start: 2016-11-20 — End: 2016-11-20
  Administered 2016-11-20 (×2): via INTRAVENOUS

## 2016-11-20 MED ORDER — PHENYLEPHRINE 10 MG/ML INJECTION
10 mg/mL | INTRAMUSCULAR | Status: DC | PRN
Start: 2016-11-20 — End: 2016-11-20
  Administered 2016-11-20 (×4): via INTRAVENOUS

## 2016-11-20 MED ORDER — DEXAMETHASONE SODIUM PHOSPHATE 4 MG/ML IJ SOLN
4 mg/mL | INTRAMUSCULAR | Status: AC
Start: 2016-11-20 — End: ?

## 2016-11-20 MED ORDER — FENTANYL CITRATE (PF) 50 MCG/ML IJ SOLN
50 mcg/mL | INTRAMUSCULAR | Status: AC
Start: 2016-11-20 — End: ?

## 2016-11-20 MED ORDER — SCOPOLAMINE (1.3-1.5) MG 72 HR TRANSDERM PATCH
1 mg over 3 days | Freq: Once | TRANSDERMAL | Status: DC
Start: 2016-11-20 — End: 2016-11-21

## 2016-11-20 MED ORDER — LABETALOL 5 MG/ML IV SOLN
5 mg/mL | INTRAVENOUS | Status: DC | PRN
Start: 2016-11-20 — End: 2016-11-20

## 2016-11-20 MED ORDER — KETOROLAC TROMETHAMINE 30 MG/ML INJECTION
30 mg/mL (1 mL) | Freq: Four times a day (QID) | INTRAMUSCULAR | Status: DC | PRN
Start: 2016-11-20 — End: 2016-11-21
  Administered 2016-11-20 (×2): via INTRAVENOUS

## 2016-11-20 MED ORDER — LIDOCAINE HCL 1 % (10 MG/ML) IJ SOLN
10 mg/mL (1 %) | Freq: Once | INTRAMUSCULAR | Status: AC | PRN
Start: 2016-11-20 — End: 2016-11-20
  Administered 2016-11-20: 12:00:00 via INTRADERMAL

## 2016-11-20 MED ORDER — MORPHINE 4 MG/ML INTRAVENOUS SOLUTION
4 mg/mL | INTRAVENOUS | Status: DC | PRN
Start: 2016-11-20 — End: 2016-11-20
  Administered 2016-11-20: 16:00:00 via INTRAVENOUS

## 2016-11-20 MED ORDER — DIPHENHYDRAMINE HCL 50 MG/ML IJ SOLN
50 mg/mL | Freq: Four times a day (QID) | INTRAMUSCULAR | Status: DC | PRN
Start: 2016-11-20 — End: 2016-11-21

## 2016-11-20 MED ORDER — LIDOCAINE (PF) 5 MG/ML (0.5 %) IJ SOLN
5 mg/mL (0. %) | INTRAMUSCULAR | Status: DC | PRN
Start: 2016-11-20 — End: 2016-11-20
  Administered 2016-11-20: 13:00:00 via INTRAVENOUS

## 2016-11-20 MED ORDER — ONDANSETRON (PF) 4 MG/2 ML INJECTION
4 mg/2 mL | INTRAMUSCULAR | Status: AC
Start: 2016-11-20 — End: ?

## 2016-11-20 MED ORDER — FENTANYL CITRATE (PF) 50 MCG/ML IJ SOLN
50 mcg/mL | INTRAMUSCULAR | Status: DC | PRN
Start: 2016-11-20 — End: 2016-11-20
  Administered 2016-11-20 (×3): via INTRAVENOUS

## 2016-11-20 MED ORDER — CEFAZOLIN 2 GM/50 ML IN DEXTROSE (ISO-OSMOTIC) IVPB
2 gram/50 mL | Freq: Once | INTRAVENOUS | Status: AC
Start: 2016-11-20 — End: 2016-11-20
  Administered 2016-11-20: 13:00:00 via INTRAVENOUS

## 2016-11-20 MED ORDER — DEXAMETHASONE SODIUM PHOSPHATE 10 MG/ML IJ SOLN
10 mg/mL | Freq: Once | INTRAMUSCULAR | Status: AC
Start: 2016-11-20 — End: 2016-11-20
  Administered 2016-11-20: 12:00:00 via INTRAVENOUS

## 2016-11-20 MED ORDER — OXYCODONE-ACETAMINOPHEN 5 MG-325 MG TAB
5-325 mg | ORAL | Status: DC | PRN
Start: 2016-11-20 — End: 2016-11-21
  Administered 2016-11-20 – 2016-11-21 (×4): via ORAL

## 2016-11-20 MED ORDER — KETOROLAC TROMETHAMINE 30 MG/ML INJECTION
30 mg/mL (1 mL) | INTRAMUSCULAR | Status: DC | PRN
Start: 2016-11-20 — End: 2016-11-20
  Administered 2016-11-20: 14:00:00 via INTRAVENOUS

## 2016-11-20 MED ORDER — DIPHENHYDRAMINE HCL 50 MG/ML IJ SOLN
50 mg/mL | Freq: Once | INTRAMUSCULAR | Status: DC | PRN
Start: 2016-11-20 — End: 2016-11-20

## 2016-11-20 MED ORDER — MORPHINE 4 MG/ML INTRAVENOUS SOLUTION
4 mg/mL | INTRAVENOUS | Status: DC | PRN
Start: 2016-11-20 — End: 2016-11-21
  Administered 2016-11-20 (×2): via INTRAVENOUS

## 2016-11-20 MED ORDER — REMOVE PATCH (GENERIC)
Freq: Once | Status: AC
Start: 2016-11-20 — End: 2016-11-21

## 2016-11-20 MED ORDER — SUCCINYLCHOLINE CHLORIDE 20 MG/ML INJECTION
20 mg/mL | INTRAMUSCULAR | Status: AC
Start: 2016-11-20 — End: ?

## 2016-11-20 MED ORDER — ACETAMINOPHEN 1,000 MG/100 ML (10 MG/ML) IV
1000 mg/100 mL (10 mg/mL) | Freq: Once | INTRAVENOUS | Status: AC
Start: 2016-11-20 — End: 2016-11-20
  Administered 2016-11-20: 12:00:00 via INTRAVENOUS

## 2016-11-20 MED ORDER — LIDOCAINE (PF) 20 MG/ML (2 %) IV SYRINGE
100 mg/5 mL (2 %) | INTRAVENOUS | Status: AC
Start: 2016-11-20 — End: ?

## 2016-11-20 MED ORDER — LACTATED RINGERS IV
INTRAVENOUS | Status: DC
Start: 2016-11-20 — End: 2016-11-20
  Administered 2016-11-20 (×2): via INTRAVENOUS

## 2016-11-20 MED ORDER — BUPIVACAINE-EPINEPHRINE (PF) 0.5 %-1:200,000 IJ SOLN
0.5 %-1:200,000 | INTRAMUSCULAR | Status: AC
Start: 2016-11-20 — End: ?

## 2016-11-20 MED ORDER — NALOXONE 0.4 MG/ML INJECTION
0.4 mg/mL | INTRAMUSCULAR | Status: DC | PRN
Start: 2016-11-20 — End: 2016-11-20

## 2016-11-20 MED ORDER — BUPIVACAINE (PF) 0.25 % (2.5 MG/ML) IJ SOLN
0.25 % (2.5 mg/mL) | INTRAMUSCULAR | Status: AC
Start: 2016-11-20 — End: ?

## 2016-11-20 MED ORDER — ONDANSETRON (PF) 4 MG/2 ML INJECTION
4 mg/2 mL | INTRAMUSCULAR | Status: DC | PRN
Start: 2016-11-20 — End: 2016-11-20
  Administered 2016-11-20: 14:00:00 via INTRAVENOUS

## 2016-11-20 MED ORDER — FLUMAZENIL 0.1 MG/ML IV SOLN
0.1 mg/mL | INTRAVENOUS | Status: DC | PRN
Start: 2016-11-20 — End: 2016-11-20

## 2016-11-20 MED ORDER — SODIUM CHLORIDE 0.9 % IJ SYRG
INTRAMUSCULAR | Status: DC | PRN
Start: 2016-11-20 — End: 2016-11-21

## 2016-11-20 MED ORDER — SIMETHICONE 80 MG CHEWABLE TAB
80 mg | Freq: Four times a day (QID) | ORAL | Status: DC | PRN
Start: 2016-11-20 — End: 2016-11-21
  Administered 2016-11-21: 17:00:00 via ORAL

## 2016-11-20 MED ORDER — IBUPROFEN 600 MG TAB
600 mg | Freq: Four times a day (QID) | ORAL | Status: DC | PRN
Start: 2016-11-20 — End: 2016-11-21
  Administered 2016-11-21: 15:00:00 via ORAL

## 2016-11-20 MED ORDER — SUGAMMADEX 100 MG/ML INTRAVENOUS SOLUTION
100 mg/mL | INTRAVENOUS | Status: AC
Start: 2016-11-20 — End: ?

## 2016-11-20 MED ORDER — FENTANYL CITRATE (PF) 50 MCG/ML IJ SOLN
50 mcg/mL | INTRAMUSCULAR | Status: DC | PRN
Start: 2016-11-20 — End: 2016-11-20
  Administered 2016-11-20 (×2): via INTRAVENOUS

## 2016-11-20 MED ORDER — PROPOFOL 10 MG/ML IV EMUL
10 mg/mL | INTRAVENOUS | Status: AC
Start: 2016-11-20 — End: ?

## 2016-11-20 MED ORDER — MIDAZOLAM 1 MG/ML IJ SOLN
1 mg/mL | INTRAMUSCULAR | Status: AC
Start: 2016-11-20 — End: ?

## 2016-11-20 MED ORDER — MORPHINE 2 MG/ML INJECTION
2 mg/mL | INTRAMUSCULAR | Status: DC | PRN
Start: 2016-11-20 — End: 2016-11-20

## 2016-11-20 MED ORDER — GLYCOPYRROLATE 0.2 MG/ML IJ SOLN
0.2 mg/mL | Freq: Once | INTRAMUSCULAR | Status: AC
Start: 2016-11-20 — End: 2016-11-20
  Administered 2016-11-20: 12:00:00 via INTRAVENOUS

## 2016-11-20 MED ORDER — SODIUM CHLORIDE 0.9 % IV
25 mg/mL | INTRAVENOUS | Status: DC | PRN
Start: 2016-11-20 — End: 2016-11-20

## 2016-11-20 MED ORDER — ZOLPIDEM 5 MG TAB
5 mg | Freq: Every evening | ORAL | Status: DC | PRN
Start: 2016-11-20 — End: 2016-11-21

## 2016-11-20 MED ORDER — HYDRALAZINE 20 MG/ML IJ SOLN
20 mg/mL | INTRAMUSCULAR | Status: DC | PRN
Start: 2016-11-20 — End: 2016-11-20

## 2016-11-20 MED ORDER — PREGABALIN 25 MG CAP
25 mg | Freq: Once | ORAL | Status: AC
Start: 2016-11-20 — End: 2016-11-20
  Administered 2016-11-20: 12:00:00 via ORAL

## 2016-11-20 MED ORDER — SUGAMMADEX 100 MG/ML INTRAVENOUS SOLUTION
100 mg/mL | INTRAVENOUS | Status: DC | PRN
Start: 2016-11-20 — End: 2016-11-20
  Administered 2016-11-20: 14:00:00 via INTRAVENOUS

## 2016-11-20 MED ORDER — EPHEDRINE SULFATE 50 MG/ML INJECTION SOLUTION
50 mg/mL | INTRAMUSCULAR | Status: DC | PRN
Start: 2016-11-20 — End: 2016-11-20
  Administered 2016-11-20: 14:00:00 via INTRAVENOUS

## 2016-11-20 MED ORDER — GUM MASTIC-STORAX-METHYLSALICYLATE-ALCOHOL IN A DROPPERETTE
Status: AC
Start: 2016-11-20 — End: ?

## 2016-11-20 MED ORDER — DOCUSATE SODIUM 100 MG CAP
100 mg | Freq: Every day | ORAL | Status: DC
Start: 2016-11-20 — End: 2016-11-21
  Administered 2016-11-21: 15:00:00 via ORAL

## 2016-11-20 MED ORDER — ROCURONIUM 10 MG/ML IV
10 mg/mL | INTRAVENOUS | Status: AC
Start: 2016-11-20 — End: ?

## 2016-11-20 MED ORDER — PROPOFOL 10 MG/ML IV EMUL
10 mg/mL | INTRAVENOUS | Status: DC | PRN
Start: 2016-11-20 — End: 2016-11-20
  Administered 2016-11-20: 13:00:00 via INTRAVENOUS

## 2016-11-20 MED FILL — ROCURONIUM 10 MG/ML IV: 10 mg/mL | INTRAVENOUS | Qty: 5

## 2016-11-20 MED FILL — ONDANSETRON (PF) 4 MG/2 ML INJECTION: 4 mg/2 mL | INTRAMUSCULAR | Qty: 2

## 2016-11-20 MED FILL — MORPHINE 4 MG/ML INTRAVENOUS SOLUTION: 4 mg/mL | INTRAVENOUS | Qty: 1

## 2016-11-20 MED FILL — OFIRMEV 1,000 MG/100 ML (10 MG/ML) INTRAVENOUS SOLUTION: 1000 mg/100 mL (10 mg/mL) | INTRAVENOUS | Qty: 100

## 2016-11-20 MED FILL — LYRICA 25 MG CAPSULE: 25 mg | ORAL | Qty: 1

## 2016-11-20 MED FILL — FENTANYL CITRATE (PF) 50 MCG/ML IJ SOLN: 50 mcg/mL | INTRAMUSCULAR | Qty: 2

## 2016-11-20 MED FILL — TRANSDERM-SCOP 1 MG OVER 3 DAYS TRANSDERMAL PATCH: 1 mg over 3 days | TRANSDERMAL | Qty: 1

## 2016-11-20 MED FILL — DIPRIVAN 10 MG/ML INTRAVENOUS EMULSION: 10 mg/mL | INTRAVENOUS | Qty: 20

## 2016-11-20 MED FILL — KETOROLAC TROMETHAMINE 30 MG/ML INJECTION: 30 mg/mL (1 mL) | INTRAMUSCULAR | Qty: 1

## 2016-11-20 MED FILL — QUELICIN 20 MG/ML INJECTION SOLUTION: 20 mg/mL | INTRAMUSCULAR | Qty: 10

## 2016-11-20 MED FILL — REMOVE PATCH (GENERIC): Qty: 1

## 2016-11-20 MED FILL — LYRICA 75 MG CAPSULE: 75 mg | ORAL | Qty: 1

## 2016-11-20 MED FILL — BRIDION 100 MG/ML INTRAVENOUS SOLUTION: 100 mg/mL | INTRAVENOUS | Qty: 2

## 2016-11-20 MED FILL — CEFAZOLIN 2 GM/50 ML IN DEXTROSE (ISO-OSMOTIC) IVPB: 2 gram/50 mL | INTRAVENOUS | Qty: 50

## 2016-11-20 MED FILL — OXYCODONE-ACETAMINOPHEN 5 MG-325 MG TAB: 5-325 mg | ORAL | Qty: 2

## 2016-11-20 MED FILL — SENSORCAINE-MPF/EPINEPHRINE 0.5 %-1:200,000 INJECTION SOLUTION: 0.5 %-1:200,000 | INTRAMUSCULAR | Qty: 30

## 2016-11-20 MED FILL — LACTATED RINGERS IV: INTRAVENOUS | Qty: 1000

## 2016-11-20 MED FILL — DEXAMETHASONE SODIUM PHOSPHATE 10 MG/ML IJ SOLN: 10 mg/mL | INTRAMUSCULAR | Qty: 1

## 2016-11-20 MED FILL — GUM MASTIC-STORAX-METHYLSALICYLATE-ALCOHOL IN A DROPPERETTE: Qty: 1

## 2016-11-20 MED FILL — DEXAMETHASONE SODIUM PHOSPHATE 4 MG/ML IJ SOLN: 4 mg/mL | INTRAMUSCULAR | Qty: 5

## 2016-11-20 MED FILL — LIDOCAINE (PF) 20 MG/ML (2 %) IV SYRINGE: 100 mg/5 mL (2 %) | INTRAVENOUS | Qty: 5

## 2016-11-20 MED FILL — GLYCOPYRROLATE 0.2 MG/ML IJ SOLN: 0.2 mg/mL | INTRAMUSCULAR | Qty: 1

## 2016-11-20 MED FILL — MIDAZOLAM 1 MG/ML IJ SOLN: 1 mg/mL | INTRAMUSCULAR | Qty: 2

## 2016-11-20 MED FILL — BUPIVACAINE (PF) 0.25 % (2.5 MG/ML) IJ SOLN: 0.25 % (2.5 mg/mL) | INTRAMUSCULAR | Qty: 30

## 2016-11-20 NOTE — Progress Notes (Signed)
Problem: Falls - Risk of  Goal: *Absence of Falls  Document Schmid Fall Risk and appropriate interventions in the flowsheet.   Outcome: Progressing Towards Goal  Fall Risk Interventions:            Medication Interventions: Patient to call before getting OOB    Elimination Interventions: Patient to call for help with toileting needs

## 2016-11-20 NOTE — Progress Notes (Signed)
Problem: Falls - Risk of  Goal: *Absence of Falls  Document Schmid Fall Risk and appropriate interventions in the flowsheet.   Outcome: Progressing Towards Goal  Fall Risk Interventions:            Medication Interventions: Patient to call before getting OOB, Teach patient to arise slowly    Elimination Interventions: Call light in reach, Patient to call for help with toileting needs, Toilet paper/wipes in reach, Toileting schedule/hourly rounds

## 2016-11-20 NOTE — Op Note (Signed)
Name: [name] BellowsLindsay D Esquilin MRN: [id number]  SSN: [id number]    Date of Birth: 1976/03/12  Age: 41 y.o.  Sex: female       Robotic TLH      Preop Diagnosis: dysmenorrhea, chronic pelvic pain  Postop Diagnosis: SAA  Procedure: TLH, bilateral salpingectomy  Surgeon: Clayton Biblesachel D Rafiq Bucklin, MD  Assistant: Mary Coffer  Anesthesia: GETA  EBL: 50cc  Findings: 9 wk size uterus, normal tubes and ovaries  Specimens: uterus with bilateral tubes to permanent pathology  Complications: none  Disposition: to the recovery room in stable condition    Procedure:     The patient was brought to the operating room where patient identification and surgery identification were completed with the patient and the OR team. The patient was transferred to the OR table and GETA was obtained without difficulty. Both arms were padded and tucked. SCD's were on and activated. The patient was placed in the lithotomy position in adjustable Allen stirrups and prepped and draped in the normal sterile fashion. Patient identification and surgery identification were repeated with the surgeon and the OR team.       A weighted speculum was placed vaginally and the anterior lip of the cervix was grasped with a single toothed tenaculum.The uterus was sounded to 9 cm. Stay sutures of 0-Vicryl were placed at 3 and 9 o'clock.  A 8 cm RUMI tip and a medium cup was applied and the RUMI was inserted without difficulty.  5 cc of NS were used to inflate the intrauterine balloon and 40 cc of NS were used to inflate the vaginal occluder balloon.   All other instruments were removed. A foley catheter was placed with clear urine noted.       Attention was turned to the abdomen.  An 11 blade scaple was then used to make a 8 mm incision 3-4 cm above the umbilicus.  A Veress needle was introduced into the abdominal cavity with anterior tenting of the abdominal wall. Pneumoperitoneum was obtained using CO2 gas to a pressure  of 15 mm of Hg. The Veress needle was removed and a 8 mm trocar port was placed without complication. The patient was placed in steep [place] and 8 mm incisions were made in the right and left lower quadrants after .25% Marcaine with epinephrine was injected using a spinal needle. 8 mm robotic trocar ports were placed under laparoscopic visualization without complication. A 5 mm non bladed trocar was then placed in the right upper quadrant again after .25% Marcaine with epinephrine was injected using a spinal needle.  The Davinci robotic system was then brought up to the patient and docked without difficulty.  The surgeon then went to the surgeons console. The pelvis was examined with the above noted findings. The left tube was grasped with an atraumatic grasper.  The bipolar cautery and monopolar scissors were used to excise the left tube which was removed from the RLQ trocar.  The right tube was removed in the same manner. The left cornual region of the uterus was grasped and the utero-ovarian, round and broad ligaments were cauterized using the bipolar cautery and transected using the monopolar scissors. The bladder flap was created across the lower uterine segment. The uterine vessel bundle was skeletonized, cauterized with the bipolar cautery and cut with the monopolar scissors.  Attention was turned to the right cornual region where the same transections were performed. The RUMI cup was then elevated and the uterus retroverted by the operator's assistant.  An anterior  colpotomy was the preformed using the monopolar scissors and extended laterally.  The uterus was then anteverted and a posterior colpotomy preformed with the monopolar scissors and extended laterally and circumferential until the completion of the colpotomy was obtained. The uterus was then removed vaginally and the vaginal occluder reinserted into the vagina and inflated to maintain pneomoperitoneum. The edges of the  vaginal cuff were examined and vaginal cuff closure was then completed using V-loc 180 suture . The pelvis was copiously irrigated and all fluid was removed. Hemostasis was again noted. The ureters were identified bilaterally with normal peristalsis noted. The robot was the then undocked from the trocar and moved away from the patient. The lower quadrant ports were removed and hemostasis was noted. Pneumoperitoneum was released and the additional 8 mm trocars and laparoscope were removed.  The incisions were closed with subcuticular 3-0 Monocryl suture and steri-strips and bandaids were placed over the incisions. Clear urine noted at the close of the procedure. Sponge, lap, needle and instrument counts were correct x 2. The patient was transferred to the RR extubated, in stable condition.     Clayton Bibles, MD  November 20, 2016

## 2016-11-20 NOTE — Other (Signed)
TRANSFER - OUT REPORT:    Verbal report given to stephanie, rn (name) on [name] [name]  being transferred to [facility](unit) for routine post - op       Report consisted of patient???s Situation, Background, Assessment and   Recommendations(SBAR).     Information from the following report(s) SBAR, Procedure Summary, Intake/Output and MAR was reviewed with the receiving nurse.    Lines:   Peripheral IV 11/20/16 Left Hand (Active)   Site Assessment Clean, dry, & intact 11/20/2016 10:25 AM   Phlebitis Assessment 0 11/20/2016 10:25 AM   Infiltration Assessment 0 11/20/2016 10:25 AM   Dressing Status Clean, dry, & intact 11/20/2016 10:25 AM   Dressing Type Transparent 11/20/2016 10:25 AM   Hub Color/Line Status Pink 11/20/2016 10:25 AM       Peripheral IV 11/20/16 Right Antecubital (Active)   Site Assessment Clean, dry, & intact 11/20/2016 10:25 AM   Phlebitis Assessment 0 11/20/2016 10:25 AM   Infiltration Assessment 0 11/20/2016 10:25 AM   Dressing Status Clean, dry, & intact 11/20/2016 10:25 AM   Dressing Type Transparent 11/20/2016 10:25 AM   Hub Color/Line Status Pink 11/20/2016 10:25 AM        Opportunity for questions and clarification was provided.      Patient transported with:   O2 @ 2 liters  Tech

## 2016-11-20 NOTE — Anesthesia Pre-Procedure Evaluation (Addendum)
Anesthetic History               Review of Systems / Medical History  Patient summary reviewed, nursing notes reviewed and pertinent labs reviewed    Pulmonary        Sleep apnea: CPAP           Neuro/Psych         Psychiatric history (Anxiety, depression, PTSD)     Cardiovascular                       GI/Hepatic/Renal     GERD           Endo/Other    Diabetes  Hypothyroidism  Obesity and anemia     Other Findings            Physical Exam    Airway  Mallampati: II  TM Distance: 4 - 6 cm         Cardiovascular  Regular rate and rhythm,  S1 and S2 normal,  no murmur, click, rub, or gallop             Dental  No notable dental hx       Pulmonary  Breath sounds clear to auscultation               Abdominal         Other Findings            Anesthetic Plan    ASA: 3  Anesthesia type: general          Induction: Intravenous  Anesthetic plan and risks discussed with: Patient and Spouse

## 2016-11-20 NOTE — H&P (Signed)
Gynecology History and Physical    Name: [name] [name] [name] [name] MRN: [id number] SSN: [id number]    Date of Birth: [birth year]  Age: 41 y.o.  Sex: female       Subjective:      Chief complaint:  Dysmenorrhea    [name] [name] is a 41 y.o. Caucasian female with a history of dysmenorrhea. Previous workup included Ultrasound which was unremarkable. Previous treatment measures included laparoscopy and progesterone intrauterine device. She is admitted for Procedure(s) (LRB):  DAVINCI XI ASSIST TOTAL LAPAROSCOPIC HYSTERECTOMY; BILATERAL SALPINGECTOMY  (N/A).    The current method of family planning is intrauterine device.    OB History     No data available        Past Medical History:   Diagnosis Date   ??? Anxiety    ??? Diabetes (HCC)     states borderline   ??? GERD (gastroesophageal reflux disease)    ??? Heavy menses    ??? High cholesterol    ??? Iron deficiency    ??? IUD (intrauterine device) in place     states 6 months ago   ??? Ovarian cyst    ??? Pelvic pain    ??? Psychiatric disorder     anxiety, depression   ??? PTSD (post-traumatic stress disorder)    ??? Thyroid disease    ??? Unspecified sleep apnea     CPAP     Past Surgical History:   Procedure Laterality Date   ??? HX CARPAL TUNNEL RELEASE Bilateral    ??? HX CHOLECYSTECTOMY     ??? HX KNEE ARTHROSCOPY     ??? HX PELVIC LAPAROSCOPY  09/2016   ??? HX SHOULDER ARTHROSCOPY Right      Social History     Occupational History   ??? Not on file.     Social History Main Topics   ??? Smoking status: Never Smoker   ??? Smokeless tobacco: Never Used   ??? Alcohol use Yes      Comment: RARELY   ??? Drug use: No   ??? Sexual activity: Not on file     Family History   Problem Relation Age of Onset   ??? Heart Disease Father    ??? Cancer Paternal Grandmother      lung   ??? Heart Disease Paternal Grandfather         Allergies   Allergen Reactions   ??? Sulfa (Sulfonamide Antibiotics) Rash     Prior to Admission medications    Medication Sig Start Date End Date Taking? Authorizing Provider    busPIRone (BUSPAR) 30 mg tablet Take 30 mg by mouth nightly.   Yes Historical Provider   ferrous sulfate (SLOW RELEASE IRON) 142 mg (45 mg iron) ER tablet Take  by mouth nightly.   Yes Historical Provider   levothyroxine (SYNTHROID) 25 mcg tablet Take 25 mcg by mouth Daily (before breakfast).   Yes Historical Provider   metFORMIN ER 1,000 mg tr24 Take 2,000 mg by mouth nightly.   Yes Historical Provider   pantoprazole (PROTONIX) 40 mg tablet Take 40 mg by mouth two (2) times a day.   Yes Historical Provider   montelukast (SINGULAIR) 10 mg tablet Take 10 mg by mouth nightly.   Yes Historical Provider   SYRINGE WITH NEEDLE, 1 ML (ALLERGY SYRINGE) by IntraMUSCular route. Indications: every 1-3 weeks   Yes Historical Provider   multivitamin (ONE A DAY) tablet Take 1 Tab by mouth nightly.    Historical Provider   IBUPROFEN (MOTRIN PO)  Take 600 mg by mouth as needed.    Historical Provider   simvastatin (ZOCOR) 10 mg tablet Take 15 mg by mouth nightly.    Historical Provider   spironolactone (ALDACTONE) 100 mg tablet Take 150 mg by mouth every evening.    Historical Provider   Cetirizine (ZYRTEC) 10 mg cap Take 1 Cap by mouth nightly.    Historical Provider        Review of Systems:  Pertinent items are noted in the History of Present Illness.     Objective:     Vitals:    11/20/16 0644   BP: 116/80   Pulse: 83   Resp: 20   Temp: 97.9 ??F (36.6 ??C)   SpO2: 96%   Weight: 91.2 kg (201 lb 1 oz)   Height: 5\' 6"  (1.676 m)       Physical Exam:  Deferred    Assessment:     Active Problems:    * No active hospital problems. *     Dysmenorrhea with chronic pelvic pain    Plan:     Procedure(s) (LRB):  DAVINCI XI ASSIST TOTAL LAPAROSCOPIC HYSTERECTOMY; BILATERAL SALPINGECTOMY  (N/A)  Discussed the risks of surgery including the risks of bleeding, infection, deep vein thrombosis, and surgical injuries to internal organs including but not limited to the bowels, bladder, rectum, and female reproductive  organs. The patient understands the risks; any and all questions were answered to the patient's satisfaction.    Signed By:  Clayton Bibles, MD     November 20, 2016

## 2016-11-20 NOTE — Anesthesia Post-Procedure Evaluation (Signed)
Post-Anesthesia Evaluation and Assessment    Patient: [name] [name] MRN: [id number]  SSN: [id number]    Date of Birth: 1975/09/19  Age: 41 y.o.  Sex: female       Cardiovascular Function/Vital Signs  Visit Vitals   ??? BP 99/56   ??? Pulse 80   ??? Temp 36.7 ??C (98 ??F)   ??? Resp 15   ??? Ht 5\' 6"  (1.676 m)   ??? Wt 91.2 kg (201 lb 1 oz)   ??? SpO2 96%   ??? BMI 32.45 kg/m2       Patient is status post general anesthesia for Procedure(s):  DAVINCI XI ASSIST TOTAL LAPAROSCOPIC HYSTERECTOMY; BILATERAL SALPINGECTOMY , IUD removal.    Nausea/Vomiting: None    Postoperative hydration reviewed and adequate.    Pain:  Pain Scale 1: Numeric (0 - 10) (11/20/16 1113)  Pain Intensity 1: 4 (11/20/16 1113)   Managed    Neurological Status:   Neuro (WDL): Exceptions to WDL (11/20/16 1025)  Neuro  Neurologic State: Drowsy;Appropriate for age (11/20/16 1025)   At baseline    Mental Status and Level of Consciousness: Arousable    Pulmonary Status:   O2 Device: Nasal cannula (11/20/16 1113)   Adequate oxygenation and airway patent    Complications related to anesthesia: None    Post-anesthesia assessment completed. No concerns    Signed By: Therese SarahJoseph W Cunningham, MD     November 20, 2016

## 2016-11-20 NOTE — Op Note (Signed)
Op Notes by Clayton Bibles, MD at 11/20/16 [phone]                Author: Clayton Bibles, MD  Service: Obstetrics & Gynecology  Author Type: Physician       Filed: 11/20/16 0920  Date of Service: 11/20/16 0918  Status: Signed          Editor: Clayton Bibles, MD (Physician)                           Name: [name] [name]  MRN: [id number]   SSN: [id number]          Date of Birth: 1976/05/02   Age: 41 y.o.   Sex: female          Robotic TLH         Preop Diagnosis: dysmenorrhea, chronic pelvic pain   Postop Diagnosis: SAA   Procedure: TLH, bilateral salpingectomy   Surgeon: Clayton Bibles, MD   Assistant: Mary Coffer   Anesthesia: GETA   EBL: 50cc   Findings: 9 wk size uterus, normal tubes and ovaries   Specimens: uterus with bilateral tubes to permanent pathology   Complications: none   Disposition: to the recovery room in stable condition      Procedure:      The patient was brought to the operating room where patient identification and surgery identification were completed with the patient and the OR team. The patient was transferred to the OR table and GETA was obtained without difficulty. Both arms were  padded and tucked. SCD's were on and activated. The patient was placed in the lithotomy position in adjustable Allen stirrups and prepped and draped in the normal sterile fashion. Patient identification and surgery identification were repeated with the  surgeon and the OR team.        A weighted speculum was placed vaginally and the anterior lip of the cervix was grasped with a single toothed tenaculum.The uterus was sounded to 9 cm. Stay sutures of 0-Vicryl were placed at 3 and 9 o'clock.  A 8 cm RUMI tip and a medium cup was  applied and the RUMI was inserted without difficulty.  5 cc of NS were used to inflate the intrauterine balloon and 40 cc of NS were used to inflate the vaginal occluder balloon.   All other instruments were removed. A foley catheter was placed with clear  urine noted.        Attention  was turned to the abdomen.  An 11 blade scaple was then used to make a 8 mm incision 3-4 cm above the umbilicus.  A Veress needle was introduced into the abdominal cavity with anterior tenting of the abdominal wall. Pneumoperitoneum was  obtained using CO2 gas to a pressure of 15 mm of Hg. The Veress needle was removed and a 8 mm trocar port was placed without complication. The patient was placed in steep [place] and 8 mm incisions were made in the right and left lower quadrants  after .25% Marcaine with epinephrine was injected using a spinal needle. 8 mm robotic trocar ports were placed under laparoscopic visualization without complication. A 5 mm non bladed trocar was then placed in the right upper quadrant again after .25%  Marcaine with epinephrine was injected using a spinal needle.  The Davinci robotic system was then brought up to the patient and docked without difficulty.  The surgeon then went to the surgeons  console. The pelvis was examined with the above noted findings.  The left tube was grasped with an atraumatic grasper.  The bipolar cautery and monopolar scissors were used to excise the left tube which was removed from the RLQ trocar.  The right tube was removed in the same manner. The left cornual region of the uterus  was grasped and the utero-ovarian, round and broad ligaments were cauterized using the bipolar cautery and transected using the monopolar scissors. The bladder flap was created across the lower uterine segment. The uterine vessel bundle was skeletonized,  cauterized with the bipolar cautery and cut with the monopolar scissors.  Attention was turned to the right cornual region where the same transections were performed. The RUMI cup was then elevated and the uterus retroverted by the operator's assistant.   An anterior colpotomy was the preformed using the monopolar scissors and extended laterally.  The uterus was then anteverted and a posterior colpotomy preformed with the  monopolar scissors and extended laterally and circumferential until the completion  of the colpotomy was obtained. The uterus was then removed vaginally and the vaginal occluder reinserted into the vagina and inflated to maintain pneomoperitoneum. The edges of the vaginal cuff were examined and vaginal cuff closure was then completed  using V-loc 180 suture . The pelvis was copiously irrigated and all fluid was removed. Hemostasis was again noted. The ureters were identified bilaterally with normal peristalsis noted. The robot was the then undocked from the trocar and moved away from  the patient. The lower quadrant ports were removed and hemostasis was noted. Pneumoperitoneum was released and the additional 8 mm trocars and laparoscope were removed.  The incisions were closed with subcuticular 3-0 Monocryl suture and steri-strips  and bandaids were placed over the incisions. Clear urine noted at the close of the procedure. Sponge, lap, needle and instrument counts were correct x 2. The patient was transferred to the RR extubated, in stable condition.       Clayton Biblesachel D Aftan Vint, MD   November 20, 2016

## 2016-11-21 LAB — HGB & HCT
HCT: 35.6 % — ABNORMAL LOW (ref 37.0–50.0)
HGB: 11.9 gm/dl — ABNORMAL LOW (ref 13.0–17.2)

## 2016-11-21 MED FILL — OXYCODONE-ACETAMINOPHEN 5 MG-325 MG TAB: 5-325 mg | ORAL | Qty: 2

## 2016-11-21 MED FILL — GAS-X 80 MG CHEWABLE TABLET: 80 mg | ORAL | Qty: 1

## 2016-11-21 MED FILL — BD POSIFLUSH NORMAL SALINE 0.9 % INJECTION SYRINGE: INTRAMUSCULAR | Qty: 10

## 2016-11-21 MED FILL — IBUPROFEN 600 MG TAB: 600 mg | ORAL | Qty: 1

## 2016-11-21 MED FILL — DOCUSATE SODIUM 100 MG CAP: 100 mg | ORAL | Qty: 1

## 2016-11-21 NOTE — Other (Signed)
Received patient reviewed sbar

## 2016-11-21 NOTE — Progress Notes (Signed)
Post-Operative Day Number 1 Progress Note    Patient doing well post-op day 1 from a TLH without significant complaints.  Pain controlled on current medication.  Voiding without difficulty, tolerating regular diet.    Vitals:  Patient Vitals for the past 8 hrs:   BP Temp Pulse Resp SpO2   11/21/16 0810 90/46 97.9 ??F (36.6 ??C) 81 16 98 %   11/21/16 0350 110/59 98 ??F (36.7 ??C) 67 12 98 %     Temp (24hrs), Avg:98 ??F (36.7 ??C), Min:97.3 ??F (36.3 ??C), Max:98.8 ??F (37.1 ??C)      Vital signs stable, afebrile.    Exam:  Patient without distress.               Abdomen soft, non tender.                 Incisions dry and clean without erythema.               Lower extremities are negative for swelling, cords or tenderness.    Lab/Data Review:  CBC:   Lab Results   Component Value Date/Time    HGB 11.9 (L) 11/21/2016 05:06 AM    HCT 35.6 (L) 11/21/2016 05:06 AM       Assessment and Plan:  Patient appears to be having uncomplicated post-op course. Discharge home.

## 2016-11-21 NOTE — Other (Addendum)
----------  DocumentID: [id number]------------------------------------------------              [facility]                       Patient Education Report         Name: [name] [name], [name] [name]                  Date: 11/20/2016    MRN: [id number]                    Time: 11:49:50 AM         Patient ordered video: 'Patient Safety: Stay Safe While you are in the Hospital'    from [id number] via phone number: [phone] at 11:49:50 AM    Description: This program outlines some of the precautions patients can take to ensure a speedy recovery without extra complications. The video emphasizes the importance of communicating with the healthcare team.    ----------DocumentID: [id number]------------------------------------------------                       [facility]          Patient Education Report - Discharge Summary        Date: 11/21/2016   Time: 12:25:35 PM   Name: [name] [name], [name] [name]   MRN: [id number]      Account Number: [id number]      Education History:        Patient ordered video: 'Patient Safety: Stay Safe While you are in the Hospital' from [id number] on 11/20/2016 11:49:50 AM

## 2016-11-22 MED FILL — BRIDION 100 MG/ML INTRAVENOUS SOLUTION: 100 mg/mL | INTRAVENOUS | Qty: 200

## 2016-11-22 MED FILL — KETOROLAC TROMETHAMINE 30 MG/ML INJECTION: 30 mg/mL (1 mL) | INTRAMUSCULAR | Qty: 30

## 2016-11-22 MED FILL — EPHEDRINE SULFATE 50 MG/ML INTRAVENOUS SOLUTION: 50 mg/mL | INTRAVENOUS | Qty: 5

## 2016-11-22 MED FILL — ONDANSETRON (PF) 4 MG/2 ML INJECTION: 4 mg/2 mL | INTRAMUSCULAR | Qty: 4

## 2016-11-22 MED FILL — LIDOCAINE (PF) 5 MG/ML (0.5 %) IJ SOLN: 5 mg/mL (0. %) | INTRAMUSCULAR | Qty: 1

## 2016-11-22 MED FILL — LIDOCAINE (PF) 5 MG/ML (0.5 %) IJ SOLN: 5 mg/mL (0. %) | INTRAMUSCULAR | Qty: 127

## 2016-11-22 MED FILL — PROPOFOL 10 MG/ML IV EMUL: 10 mg/mL | INTRAVENOUS | Qty: 20

## 2016-11-22 MED FILL — PHENYLEPHRINE 10 MG/ML INJECTION: 10 mg/mL | INTRAMUSCULAR | Qty: 400

## 2016-11-22 MED FILL — ROCURONIUM 10 MG/ML IV: 10 mg/mL | INTRAVENOUS | Qty: 70

## 2016-11-22 MED FILL — XYLOCAINE-MPF 20 MG/ML (2 %) INJECTION SOLUTION: 20 mg/mL (2 %) | INTRAMUSCULAR | Qty: 40

## 2020-03-07 DIAGNOSIS — H1013 Acute atopic conjunctivitis, bilateral: Secondary | ICD-10-CM | POA: Diagnosis not present

## 2020-03-07 DIAGNOSIS — J309 Allergic rhinitis, unspecified: Secondary | ICD-10-CM | POA: Diagnosis not present

## 2020-03-14 DIAGNOSIS — G4733 Obstructive sleep apnea (adult) (pediatric): Secondary | ICD-10-CM | POA: Diagnosis not present

## 2020-04-04 DIAGNOSIS — Z516 Encounter for desensitization to allergens: Secondary | ICD-10-CM | POA: Diagnosis not present

## 2020-04-04 DIAGNOSIS — H1013 Acute atopic conjunctivitis, bilateral: Secondary | ICD-10-CM | POA: Diagnosis not present

## 2020-04-04 DIAGNOSIS — J301 Allergic rhinitis due to pollen: Secondary | ICD-10-CM | POA: Diagnosis not present

## 2020-04-04 DIAGNOSIS — J3089 Other allergic rhinitis: Secondary | ICD-10-CM | POA: Diagnosis not present

## 2020-04-21 DIAGNOSIS — J0100 Acute maxillary sinusitis, unspecified: Secondary | ICD-10-CM | POA: Diagnosis not present

## 2020-04-21 DIAGNOSIS — Z1152 Encounter for screening for COVID-19: Secondary | ICD-10-CM | POA: Diagnosis not present

## 2020-06-05 DIAGNOSIS — F331 Major depressive disorder, recurrent, moderate: Secondary | ICD-10-CM | POA: Diagnosis not present

## 2020-06-05 DIAGNOSIS — F902 Attention-deficit hyperactivity disorder, combined type: Secondary | ICD-10-CM | POA: Diagnosis not present

## 2020-06-05 DIAGNOSIS — F411 Generalized anxiety disorder: Secondary | ICD-10-CM | POA: Diagnosis not present

## 2020-06-12 DIAGNOSIS — F902 Attention-deficit hyperactivity disorder, combined type: Secondary | ICD-10-CM | POA: Diagnosis not present

## 2020-06-12 DIAGNOSIS — F331 Major depressive disorder, recurrent, moderate: Secondary | ICD-10-CM | POA: Diagnosis not present

## 2020-06-12 DIAGNOSIS — F411 Generalized anxiety disorder: Secondary | ICD-10-CM | POA: Diagnosis not present

## 2020-06-19 DIAGNOSIS — F331 Major depressive disorder, recurrent, moderate: Secondary | ICD-10-CM | POA: Diagnosis not present

## 2020-06-19 DIAGNOSIS — F411 Generalized anxiety disorder: Secondary | ICD-10-CM | POA: Diagnosis not present

## 2020-06-19 DIAGNOSIS — G4733 Obstructive sleep apnea (adult) (pediatric): Secondary | ICD-10-CM | POA: Diagnosis not present

## 2020-06-19 DIAGNOSIS — F902 Attention-deficit hyperactivity disorder, combined type: Secondary | ICD-10-CM | POA: Diagnosis not present

## 2020-06-27 ENCOUNTER — Ambulatory Visit (INDEPENDENT_AMBULATORY_CARE_PROVIDER_SITE_OTHER): Payer: BLUE CROSS/BLUE SHIELD | Admitting: Family Medicine

## 2020-06-27 ENCOUNTER — Encounter: Payer: Self-pay | Admitting: Family Medicine

## 2020-06-27 VITALS — BP 108/78 | HR 77 | Temp 98.3°F | Ht 66.0 in | Wt 186.8 lb

## 2020-06-27 DIAGNOSIS — E782 Mixed hyperlipidemia: Secondary | ICD-10-CM | POA: Diagnosis not present

## 2020-06-27 DIAGNOSIS — K529 Noninfective gastroenteritis and colitis, unspecified: Secondary | ICD-10-CM

## 2020-06-27 DIAGNOSIS — J301 Allergic rhinitis due to pollen: Secondary | ICD-10-CM | POA: Insufficient documentation

## 2020-06-27 DIAGNOSIS — L700 Acne vulgaris: Secondary | ICD-10-CM

## 2020-06-27 DIAGNOSIS — K219 Gastro-esophageal reflux disease without esophagitis: Secondary | ICD-10-CM | POA: Insufficient documentation

## 2020-06-27 DIAGNOSIS — Z8249 Family history of ischemic heart disease and other diseases of the circulatory system: Secondary | ICD-10-CM

## 2020-06-27 DIAGNOSIS — Z9989 Dependence on other enabling machines and devices: Secondary | ICD-10-CM

## 2020-06-27 DIAGNOSIS — R7303 Prediabetes: Secondary | ICD-10-CM | POA: Diagnosis not present

## 2020-06-27 DIAGNOSIS — G4733 Obstructive sleep apnea (adult) (pediatric): Secondary | ICD-10-CM

## 2020-06-27 DIAGNOSIS — F411 Generalized anxiety disorder: Secondary | ICD-10-CM

## 2020-06-27 LAB — CBC WITH DIFFERENTIAL/PLATELET
Absolute Monocytes: 582 cells/uL (ref 200–950)
Platelets: 361 10*3/uL (ref 140–400)

## 2020-06-27 NOTE — Progress Notes (Signed)
Subjective  CC:  Chief Complaint  Patient presents with  . New Patient (Initial Visit)  . Prediabetes  . Hyperlipidemia  . Anxiety  . Gastroesophageal Reflux    HPI: Crystal [name] is a 44 y.o. female who presents to [facility] today to establish care with me as a new patient.  Very pleasant 44 year old married female with 3 children living in the home currently and 2 grown children from a prior relationship.  Recently relocated here from [place] 3 months ago.  Reviewed medical history in detail and updated problem list.  She has the following concerns or needs:  Premature history of heart disease and early death.  Her father died at age 6.  Since that time, she was in her early 6s, she has suffered from anxiety.  Mainly revolves around the fear of loss, especially loved ones.  She has been tried on multiple medications including Lexapro and Wellbutrin without good success.  She currently is maintained on BuSpar once or twice daily.  This does a good job for her.  She has no panic attacks.  She has sought counseling in the past.  Because of her risk factor of early heart disease, she has been treated very conservatively.  She has been on Metformin for what she describes as prediabetes or, at risk for diabetes.  She also is on a statin.  Metformin causes diarrhea and she does suffer from chronic diarrhea.  GI evaluation has been unremarkable to date.  She has had 3 colonoscopies.  I do not have lab work for my review.  She reports the last levels were taken in March.  She is nonfasting today.  She denies symptoms of hyperglycemia, chest pain, shortness of breath, abdominal pain.  She has well-controlled GERD on Protonix.  She also suffers from seasonal allergies.  She wears a CPAP machine for her sleep apnea.  She reports a healthy diet and maintains a healthy weight.  She has lost 40 to 50 pounds over the last 5 to 10 years.  Health  maintenance is up-to-date.  No longer need cervical cancer screenings due to partial hysterectomy.  She is premenopausal.  Immunizations are up-to-date.  She will get the flu shot soon with her family members.  Assessment  1. Prediabetes   2. Mixed hyperlipidemia   3. Acne vulgaris   4. Family history of premature CAD   5. Gastroesophageal reflux disease without esophagitis   6. Generalized anxiety disorder   7. Seasonal allergic rhinitis due to pollen   8. OSA on CPAP      Plan   Prediabetes: Had long discussion about treating with medications versus healthy lifestyle.  We will check baseline A1c today.  Would like to get old records and stop Metformin to see if she really needs it.  Given her diarrhea I would favor her stopping it.  Discussed healthy diet.  Reported hyperlipidemia: Need to recalculate her atherosclerotic risk score to see if a statin is indicated.  Would like to review old records.  Check baseline today on statin.  Nonfasting state.  Check LFTs.  Well-controlled GERD, acne and anxiety.  Continue BuSpar, Protonix and allergy medication.  Counseling regarding anxiety and family history of premature CAD.  Need to control anxiety.  This will also help lower her cardiovascular risk.  Patient agrees.  Reported well-controlled sleep apnea on CPAP.  She has followed up with pulmonology annually.  Follow up: 3 months to recheck  sugars and cholesterol. Orders Placed This Encounter  Procedures  . CBC with Differential/Platelet  . COMPLETE METABOLIC PANEL WITH GFR  . Lipid panel  . TSH  . Hemoglobin A1c   No orders of the defined types were placed in this encounter.    No flowsheet data found.  We updated and reviewed the patient's past history in detail and it is documented below.  Patient Active Problem List   Diagnosis Date Noted  . Prediabetes 06/27/2020  . Mixed hyperlipidemia 06/27/2020  . Acne vulgaris 06/27/2020  . Family history of premature CAD 06/27/2020   . Gastroesophageal reflux disease without esophagitis 06/27/2020  . Generalized anxiety disorder 06/27/2020  . Seasonal allergic rhinitis due to pollen 06/27/2020  . OSA on CPAP    Health Maintenance  Topic Date Due  . Hepatitis C Screening  Never done  . COVID-19 Vaccine (1) Never done  . HIV Screening  Never done  . TETANUS/TDAP  Never done  . INFLUENZA VACCINE  07/28/2020 (Originally 04/16/2020)  . PAP SMEAR-Modifier  Discontinued    There is no immunization history on file for this patient. Current Meds  Medication Sig  . busPIRone (BUSPAR) 30 MG tablet Take 30 mg by mouth 2 (two) times daily.  . cetirizine (ZYRTEC) 10 MG tablet Take 10 mg by mouth daily.  . Cholecalciferol (VITAMIN D3) 50 MCG (2000 UT) CAPS Take by mouth once a week.  . Iron Combinations (IRON COMPLEX PO) Take by mouth.  . metFORMIN (GLUCOPHAGE) 1000 MG tablet Take 1,000 mg by mouth 2 (two) times daily with a meal.  . pantoprazole (PROTONIX) 20 MG tablet Take 20 mg by mouth daily.  . simvastatin (ZOCOR) 20 MG tablet Take 20 mg by mouth daily.  Marland Kitchen spironolactone (ALDACTONE) 25 MG tablet Take 150 mg by mouth daily.  . [DISCONTINUED] metFORMIN (GLUMETZA) 1000 MG (MOD) 24 hr tablet Take 1,000 mg by mouth 2 (two) times daily.    Allergies: Patient is allergic to sulfa antibiotics. Past Medical History Patient  has a past medical history of Allergy, Anxiety, GERD (gastroesophageal reflux disease), Hyperlipidemia, OSA on CPAP, Prediabetes, Sleep apnea, and Thyroid disease. Past Surgical History Patient  has a past surgical history that includes Bilateral carpal tunnel release (2001); Cholecystectomy; Shoulder arthroscopy (Right, 2010, 2012); knee surgery right (2013); and Partial hysterectomy (2017). Family History: Patient family history includes Arthritis in her maternal grandmother and mother; COPD in her paternal grandmother; Colon cancer in her maternal grandfather; Depression in her daughter; Early death in her  paternal grandfather; Healthy in her brother and sister; Heart disease in her paternal grandfather and paternal uncle; Heart disease (age of onset: 64) in her father; Hypertension in her mother; Kidney disease in her maternal grandmother. Social History:  Patient  reports that she has never smoked. She has never used smokeless tobacco. She reports current alcohol use of about 2.0 standard drinks of alcohol per week. She reports that she does not use drugs.  Review of Systems: Constitutional: negative for fever or malaise Ophthalmic: negative for photophobia, double vision or loss of vision Cardiovascular: negative for chest pain, dyspnea on exertion, or new LE swelling Respiratory: negative for SOB or persistent cough Gastrointestinal: negative for abdominal pain, change in bowel habits or melena Genitourinary: negative for dysuria or gross hematuria Musculoskeletal: negative for new gait disturbance or muscular weakness Integumentary: negative for new or persistent rashes Neurological: negative for TIA or stroke symptoms Psychiatric: negative for SI or delusions Allergic/Immunologic: negative for hives  Patient Care  Team    Relationship Specialty Notifications Start End  Willow Ora, MD PCP - General Family Medicine  06/27/20     Objective  Vitals: BP 108/78   Pulse 77   Temp 98.3 F (36.8 C) (Temporal)   Ht 5\' 6"  (1.676 m)   Wt 186 lb 12.8 oz (84.7 kg)   SpO2 97%   BMI 30.15 kg/m  General:  Well developed, well nourished, no acute distress  Psych:  Alert and oriented,normal mood and affect HEENT:  Normocephalic, atraumatic, non-icteric sclera, supple neck without adenopathy, mass or thyromegaly Cardiovascular:  RRR without gallop, rub or murmur Respiratory:  Good breath sounds bilaterally, CTAB with normal respiratory effort  Commons side effects, risks, benefits, and alternatives for medications and treatment plan prescribed today were discussed, and the patient expressed  understanding of the given instructions. Patient is instructed to call or message via MyChart if he/she has any questions or concerns regarding our treatment plan. No barriers to understanding were identified. We discussed Red Flag symptoms and signs in detail. Patient expressed understanding regarding what to do in case of urgent or emergency type symptoms.   Medication list was reconciled, printed and provided to the patient in AVS. Patient instructions and summary information was reviewed with the patient as documented in the AVS. This note was prepared with assistance of Dragon voice recognition software. Occasional wrong-word or sound-a-like substitutions may have occurred due to the inherent limitations of voice recognition software  This visit occurred during the SARS-CoV-2 public health emergency.  Safety protocols were in place, including screening questions prior to the visit, additional usage of staff PPE, and extensive cleaning of exam room while observing appropriate contact time as indicated for disinfecting solutions.

## 2020-06-27 NOTE — Patient Instructions (Signed)
Please return in 3 months to recheck sugars and choleserol.   I will release your lab results to you on your MyChart account with further instructions. Please reply with any questions.    It was a pleasure meeting you today! Thank you for choosing [place] to meet your healthcare needs! I truly look forward to working with you. If you have any questions or concerns, please send me a message via Mychart or call the office at [phone].

## 2020-06-28 LAB — HEMOGLOBIN A1C
Hgb A1c MFr Bld: 5.6 % of total Hgb (ref <5.7)
Mean Plasma Glucose: 114 (calc)
eAG (mmol/L): 6.3 (calc)

## 2020-06-28 LAB — CBC WITH DIFFERENTIAL/PLATELET
Basophils Absolute: 21 cells/uL (ref 0–200)
Basophils Relative: 0.3 %
Eosinophils Absolute: 121 cells/uL (ref 15–500)
Eosinophils Relative: 1.7 %
HCT: 42 % (ref 35.0–45.0)
Hemoglobin: 14.5 g/dL (ref 11.7–15.5)
Lymphs Abs: 2521 cells/uL (ref 850–3900)
MCH: 31.9 pg (ref 27.0–33.0)
MCHC: 34.5 g/dL (ref 32.0–36.0)
MCV: 92.5 fL (ref 80.0–100.0)
MPV: 10.3 fL (ref 7.5–12.5)
Monocytes Relative: 8.2 %
Neutro Abs: 3855 cells/uL (ref 1500–7800)
Neutrophils Relative %: 54.3 %
RBC: 4.54 10*6/uL (ref 3.80–5.10)
RDW: 12.7 % (ref 11.0–15.0)
Total Lymphocyte: 35.5 %
WBC: 7.1 10*3/uL (ref 3.8–10.8)

## 2020-06-28 LAB — COMPLETE METABOLIC PANEL WITH GFR
AG Ratio: 1.7 (calc) (ref 1.0–2.5)
ALT: 28 U/L (ref 6–29)
AST: 19 U/L (ref 10–30)
Albumin: 4.7 g/dL (ref 3.6–5.1)
Alkaline phosphatase (APISO): 51 U/L (ref 31–125)
BUN: 11 mg/dL (ref 7–25)
CO2: 25 mmol/L (ref 20–32)
Calcium: 10.4 mg/dL — ABNORMAL HIGH (ref 8.6–10.2)
Chloride: 100 mmol/L (ref 98–110)
Creat: 0.84 mg/dL (ref 0.50–1.10)
GFR, Est African American: 100 mL/min/{1.73_m2} (ref >=60)
GFR, Est Non African American: 86 mL/min/{1.73_m2} (ref >=60)
Globulin: 2.8 g/dL (calc) (ref 1.9–3.7)
Glucose, Bld: 92 mg/dL (ref 65–99)
Potassium: 4.3 mmol/L (ref 3.5–5.3)
Sodium: 138 mmol/L (ref 135–146)
Total Bilirubin: 0.6 mg/dL (ref 0.2–1.2)
Total Protein: 7.5 g/dL (ref 6.1–8.1)

## 2020-06-28 LAB — LIPID PANEL
Cholesterol: 163 mg/dL (ref <200)
HDL: 37 mg/dL — ABNORMAL LOW (ref >=50)
LDL Cholesterol (Calc): 98 mg/dL (calc)
Non-HDL Cholesterol (Calc): 126 mg/dL (calc) (ref <130)
Total CHOL/HDL Ratio: 4.4 (calc) (ref <5.0)
Triglycerides: 188 mg/dL — ABNORMAL HIGH (ref <150)

## 2020-06-28 LAB — TSH: TSH: 3.79 mIU/L

## 2020-07-26 DIAGNOSIS — Z20822 Contact with and (suspected) exposure to covid-19: Secondary | ICD-10-CM | POA: Diagnosis not present

## 2020-07-31 DIAGNOSIS — F411 Generalized anxiety disorder: Secondary | ICD-10-CM | POA: Diagnosis not present

## 2020-07-31 DIAGNOSIS — F331 Major depressive disorder, recurrent, moderate: Secondary | ICD-10-CM | POA: Diagnosis not present

## 2020-07-31 DIAGNOSIS — F902 Attention-deficit hyperactivity disorder, combined type: Secondary | ICD-10-CM | POA: Diagnosis not present

## 2020-08-28 ENCOUNTER — Ambulatory Visit (INDEPENDENT_AMBULATORY_CARE_PROVIDER_SITE_OTHER): Payer: BC Managed Care – PPO

## 2020-08-28 ENCOUNTER — Ambulatory Visit (INDEPENDENT_AMBULATORY_CARE_PROVIDER_SITE_OTHER): Payer: BC Managed Care – PPO | Admitting: Podiatry

## 2020-08-28 ENCOUNTER — Other Ambulatory Visit: Payer: Self-pay

## 2020-08-28 DIAGNOSIS — M79672 Pain in left foot: Secondary | ICD-10-CM | POA: Diagnosis not present

## 2020-08-28 DIAGNOSIS — M722 Plantar fascial fibromatosis: Secondary | ICD-10-CM | POA: Diagnosis not present

## 2020-08-28 DIAGNOSIS — B070 Plantar wart: Secondary | ICD-10-CM

## 2020-08-28 MED ORDER — METHYLPREDNISOLONE 4 MG PO TBPK: ORAL_TABLET | ORAL | 0 refills | Status: DC

## 2020-08-28 MED ORDER — MELOXICAM 15 MG PO TABS
15.0000 mg | ORAL_TABLET | Freq: Every day | ORAL | 1 refills | Status: DC
Start: 2020-08-28 — End: 2020-10-12

## 2020-08-28 NOTE — Patient Instructions (Signed)
[employer] arch supports.

## 2020-09-03 DIAGNOSIS — B070 Plantar wart: Secondary | ICD-10-CM | POA: Diagnosis not present

## 2020-09-03 DIAGNOSIS — M722 Plantar fascial fibromatosis: Secondary | ICD-10-CM | POA: Diagnosis not present

## 2020-09-03 MED ORDER — BETAMETHASONE SOD PHOS & ACET 6 (3-3) MG/ML IJ SUSP
3.0000 mg | Freq: Once | INTRAMUSCULAR | Status: AC
  Administered 2020-09-03: 3 mg via INTRAMUSCULAR

## 2020-09-03 NOTE — Progress Notes (Signed)
   Subjective: 44 y.o. female presenting as a new patient for evaluation of two different complaints.  Patient states that she has been experiencing left heel pain off and on for approximately the past year.  She experiences throbbing sensation.  Pain when getting out of bed.  Pain with walking.  She has been soaking her foot for relief. Patient also states that she has developed some plantar warts to the right foot.  Plantar area.  She has been applying Compound W with minimal relief.  She presents for further treatment and evaluation   Past Medical History:  Diagnosis Date  . Allergy   . Anxiety   . GERD (gastroesophageal reflux disease)   . Hyperlipidemia   . OSA on CPAP   . Prediabetes   . Sleep apnea   . Thyroid disease      Objective: Physical Exam General: The patient is alert and oriented x3 in no acute distress.  Dermatology: Skin is warm, dry and supple bilateral lower extremities. Negative for open lesions or macerations bilateral.  There are 2 hyperkeratotic skin lesions noted to the plantar weightbearing surface of the right foot.  Pinpoint bleeding with debridement.  Findings consistent with a plantar verruca.  Approximately 1 cm in diameter.  Vascular: Dorsalis Pedis and Posterior Tibial pulses palpable bilateral.  Capillary fill time is immediate to all digits.  Neurological: Epicritic and protective threshold intact bilateral.   Musculoskeletal: Tenderness to palpation to the plantar aspect of the left heel along the plantar fascia. All other joints range of motion within normal limits bilateral. Strength 5/5 in all groups bilateral.   Radiographic exam: Normal osseous mineralization. Joint spaces preserved. No fracture/dislocation/boney destruction. No other soft tissue abnormalities or radiopaque foreign bodies.   Assessment: 1. Plantar fasciitis left foot 2.  Plantar warts right foot x2  Plan of Care:  1. Patient evaluated. Xrays reviewed.   2. Injection of  0.5cc Celestone soluspan injected into the left plantar fascia.  3. Rx for Medrol Dose Pak placed 4. Rx for Meloxicam ordered for patient. 5. Plantar fascial band(s) dispensed  6. Instructed patient regarding therapies and modalities at home to alleviate symptoms.  Continue Chaccos sandals at home.  7.  Recommend arch supports from [employer]  8.  Light debridement of the plantar verruca lesions was performed using a surgical 312 scalpel.  Cantharone applied.   9.  Return to clinic in 3 weeks.     Felecia Shelling, DPM [facility]  Dr. Felecia Shelling, DPM    [address] [place], [place] [address]                Office [phone]  Fax [phone]

## 2020-09-05 ENCOUNTER — Encounter: Payer: Self-pay | Admitting: Family Medicine

## 2020-09-06 ENCOUNTER — Other Ambulatory Visit: Payer: Self-pay

## 2020-09-06 MED ORDER — SIMVASTATIN 20 MG PO TABS: 20.0000 mg | ORAL_TABLET | Freq: Every day | ORAL | 3 refills | Status: DC

## 2020-09-06 MED ORDER — PANTOPRAZOLE SODIUM 20 MG PO TBEC: 20.0000 mg | DELAYED_RELEASE_TABLET | Freq: Two times a day (BID) | ORAL | 3 refills | Status: DC

## 2020-09-11 NOTE — Telephone Encounter (Signed)
Otc vitamin D 2000 units daily is recommended.   Will need to recheck levels to see if she is where she should be. We can do this at her next visit.  Do not send in high dose supplement. This is not a chronic medication.

## 2020-09-12 ENCOUNTER — Other Ambulatory Visit: Payer: Self-pay

## 2020-09-12 MED ORDER — BUSPIRONE HCL 30 MG PO TABS: 30.0000 mg | ORAL_TABLET | Freq: Two times a day (BID) | ORAL | 3 refills | Status: DC

## 2020-09-13 ENCOUNTER — Ambulatory Visit (INDEPENDENT_AMBULATORY_CARE_PROVIDER_SITE_OTHER): Payer: BC Managed Care – PPO | Admitting: Podiatry

## 2020-09-13 ENCOUNTER — Other Ambulatory Visit: Payer: Self-pay

## 2020-09-13 DIAGNOSIS — B070 Plantar wart: Secondary | ICD-10-CM | POA: Diagnosis not present

## 2020-09-13 DIAGNOSIS — M722 Plantar fascial fibromatosis: Secondary | ICD-10-CM | POA: Diagnosis not present

## 2020-09-18 DIAGNOSIS — Z23 Encounter for immunization: Secondary | ICD-10-CM | POA: Diagnosis not present

## 2020-09-18 NOTE — Progress Notes (Signed)
   Subjective: 45 y.o. female presenting for follow-up evaluation of plantar fasciitis to the left foot as well as follow-up evaluation of plantar verruca's to the right.  Last visit Cantharone was applied.  Patient states that she is doing well.  She says that the injection as well as the oral anti-inflammatory medication is helped significantly.  No new complaints at this time.  Past Medical History:  Diagnosis Date  . Allergy   . Anxiety   . GERD (gastroesophageal reflux disease)   . Hyperlipidemia   . OSA on CPAP   . Prediabetes   . Sleep apnea   . Thyroid disease      Objective: Physical Exam General: The patient is alert and oriented x3 in no acute distress.  Dermatology: Skin is warm, dry and supple bilateral lower extremities. Negative for open lesions or macerations bilateral.  There are 2 hyperkeratotic skin lesions noted to the plantar weightbearing surface of the right foot.  Approximately 1 cm in diameter.  Vascular: Dorsalis Pedis and Posterior Tibial pulses palpable bilateral.  Capillary fill time is immediate to all digits.  Neurological: Epicritic and protective threshold intact bilateral.   Musculoskeletal: Minimal tenderness to palpation to the plantar aspect of the left heel along the plantar fascia. All other joints range of motion within normal limits bilateral. Strength 5/5 in all groups bilateral.   Assessment: 1. Plantar fasciitis left foot; improved 2.  Plantar warts right foot x2; resolved  Plan of Care:  1. Patient evaluated. 2.  Continue meloxicam as needed  3.  Continue Chaccos sandals at home.  4.  Light debridement of the plantar verruca's was performed using a surgical 312 scalpel without bleeding.  The lesions were completely removed with fresh healthy underlying skin.  The Cantharone application appears to have resolved the issue 5.  Return to clinic as needed   Felecia Shelling, DPM [facility]  Dr. Felecia Shelling, DPM     [address] [place], [place] [address]                Office [phone]  Fax [phone]

## 2020-09-27 ENCOUNTER — Ambulatory Visit: Payer: Self-pay | Admitting: Family Medicine

## 2020-10-02 DIAGNOSIS — G4733 Obstructive sleep apnea (adult) (pediatric): Secondary | ICD-10-CM | POA: Diagnosis not present

## 2020-10-12 ENCOUNTER — Encounter: Payer: Self-pay | Admitting: Family Medicine

## 2020-10-12 ENCOUNTER — Ambulatory Visit (INDEPENDENT_AMBULATORY_CARE_PROVIDER_SITE_OTHER): Payer: BC Managed Care – PPO | Admitting: Family Medicine

## 2020-10-12 ENCOUNTER — Other Ambulatory Visit: Payer: Self-pay

## 2020-10-12 VITALS — BP 110/70 | HR 68 | Temp 97.9°F | Ht 66.0 in | Wt 195.0 lb

## 2020-10-12 DIAGNOSIS — E782 Mixed hyperlipidemia: Secondary | ICD-10-CM

## 2020-10-12 DIAGNOSIS — F411 Generalized anxiety disorder: Secondary | ICD-10-CM

## 2020-10-12 DIAGNOSIS — R7301 Impaired fasting glucose: Secondary | ICD-10-CM

## 2020-10-12 DIAGNOSIS — K219 Gastro-esophageal reflux disease without esophagitis: Secondary | ICD-10-CM

## 2020-10-12 DIAGNOSIS — R7303 Prediabetes: Secondary | ICD-10-CM

## 2020-10-12 DIAGNOSIS — Z8249 Family history of ischemic heart disease and other diseases of the circulatory system: Secondary | ICD-10-CM

## 2020-10-12 LAB — POCT GLYCOSYLATED HEMOGLOBIN (HGB A1C): Hemoglobin A1C: 5.9 % — AB (ref 4.0–5.6)

## 2020-10-12 MED ORDER — PANTOPRAZOLE SODIUM 20 MG PO TBEC: 40.0000 mg | DELAYED_RELEASE_TABLET | Freq: Every day | ORAL | 3 refills | Status: DC

## 2020-10-12 MED ORDER — PAROXETINE HCL 10 MG PO TABS: ORAL_TABLET | ORAL | 1 refills | Status: DC

## 2020-10-12 NOTE — Patient Instructions (Signed)
Please return in 6 weeks to recheck anxiety.  Please start paxil at night as directed  Stop simvastatin.  Start otc vit D 4000 units daily. You may take 2 tabs (20mg ) protonix daily. I have referred you to GI and will be calling you to get scheduled.   If you have any questions or concerns, please don't hesitate to send me a message via MyChart or call the office at [phone]. Thank you for visiting with [phone] today! It's our pleasure caring for you.  Anti anxiety (SSRI) Medications:  Taking the medicine as directed and not missing any doses is one of the best things you can do to treat your depression.  Here are some things to keep in mind:  1) Side effects (stomach upset, some increased anxiety) may happen before you notice a benefit.  These side effects typically go away over time. 2) Changes to your dose of medicine or a change in medication all together is sometimes necessary 3) Most people need to be on medication at least 6-12 months 4) Many people will notice an improvement within two weeks but the full effect of the medication can take up to 4-6 weeks 5) Stopping the medication when you start feeling better often results in a return of symptoms 6) If you start having thoughts of hurting yourself or others after starting this medicine, please call the office immediately at [phone].

## 2020-10-12 NOTE — Progress Notes (Signed)
Subjective  CC:  Chief Complaint  Patient presents with  . Prediabetes  . Hyperlipidemia    HPI: Crystal [name] is a 45 y.o. female who presents to the office today for follow up of diabetes and problems listed above in the chief complaint.   3 mo f/u for prediabetes. Off metformin x 3 months. She has gained weight. Feels otherwise well. Diarrhea has improved off metformin. Now with mild constipation at times.   HLD on simvastatin. Don't have old labs. See below for lipid panel on statin.   Normotensive  GERD: reports severe and chronic gerd controlled on high dose protonix. Needs 40 daily or will have breakthrough sxs. Has to monitor diet carefully: "one glass of orange juice will be the death of me". Avoids alcohol and spicy foods. Has had e EGD with what sounds like gastritis. No PUD. No melena. Last eval > 10 years ago. Wants to know if there are other options.   Anxiety: buspar 30 at night and now needing more xanax. Has long standing anxiety. Can't sleep. Worries about loss. No panic attacks. No depressive sxs. Has been on lexapro and wellburin and zoloft in the past. Denies problems with meds.   Wt Readings from Last 3 Encounters:  10/12/20 195 lb (88.5 kg)  06/27/20 186 lb 12.8 oz (84.7 kg)    BP Readings from Last 3 Encounters:  10/12/20 110/70  06/27/20 108/78    Assessment  1. Prediabetes   2. Gastroesophageal reflux disease without esophagitis   3. Generalized anxiety disorder   4. Impaired fasting glucose   5. Mixed hyperlipidemia   6. Family history of premature CAD      Plan   IFG: counseling done. rec better nutrition and weight loss and exercise. Recheck 6 months.   GERD: continue protonix and refer to GI for their input.   GAD: very active. Start paxil nighlty along with buspar. Counseling done.   HLD: ? Need for statin. Will hold for now and recheck fasting levels off meds in 6 months. Can restart if indicated then. rec healthy  diet.    Follow up: 6 weeks for recheck anxiety . Orders Placed This Encounter  Procedures  . Ambulatory referral to Gastroenterology  . POCT glycosylated hemoglobin (Hb A1C)   Meds ordered this encounter  Medications  . pantoprazole (PROTONIX) 20 MG tablet    Sig: Take 2 tablets (40 mg total) by mouth daily.    Dispense:  180 tablet    Refill:  3  . PARoxetine (PAXIL) 10 MG tablet    Sig: Take 1 tablet (10 mg total) by mouth at bedtime for 7 days, THEN 2 tablets (20 mg total) at bedtime for 21 days.    Dispense:  60 tablet    Refill:  1      Immunization History  Administered Date(s) Administered  . Influenza Split 07/27/2012, 06/14/2013  . Influenza,inj,Quad PF,6+ Mos 09/18/2020  . Influenza,inj,quad, With Preservative 07/20/2014  . Moderna Sars-Covid-2 Vaccination 11/24/2019, 12/21/2019  . Tdap 04/14/2009    Diabetes Related Lab Review: Lab Results  Component Value Date   HGBA1C 5.9 (A) 10/12/2020   HGBA1C 5.6 06/27/2020    No results found for: [place] [place] Lab Results  Component Value Date   CREATININE 0.84 06/27/2020   BUN 11 06/27/2020   NA 138 06/27/2020   K 4.3 06/27/2020   CL 100 06/27/2020   CO2 25 06/27/2020   Lab Results  Component Value Date   CHOL  163 06/27/2020   Lab Results  Component Value Date   HDL 37 (L) 06/27/2020   Lab Results  Component Value Date   LDLCALC 98 06/27/2020   Lab Results  Component Value Date   TRIG 188 (H) 06/27/2020   Lab Results  Component Value Date   CHOLHDL 4.4 06/27/2020   No results found for: LDLDIRECT The 10-year ASCVD risk score Denman George DC Jr., et al., 2013) is: 0.7%   Values used to calculate the score:     Age: 24 years     Sex: Female     Is Non-Hispanic African American: No     Diabetic: No     Tobacco smoker: No     Systolic Blood Pressure: 110 mmHg     Is BP treated: No     HDL Cholesterol: 37 mg/dL     Total Cholesterol: 163 mg/dL I have reviewed the PMH, Fam and Soc  history. Patient Active Problem List   Diagnosis Date Noted  . Prediabetes 06/27/2020  . Mixed hyperlipidemia 06/27/2020  . Acne vulgaris 06/27/2020  . Family history of premature CAD 06/27/2020  . Gastroesophageal reflux disease without esophagitis 06/27/2020  . Generalized anxiety disorder 06/27/2020  . Seasonal allergic rhinitis due to pollen 06/27/2020  . Chronic diarrhea 06/27/2020  . OSA on CPAP     Social History: Patient  reports that she has never smoked. She has never used smokeless tobacco. She reports current alcohol use of about 2.0 standard drinks of alcohol per week. She reports that she does not use drugs.  Review of Systems: Ophthalmic: negative for eye pain, loss of vision or double vision Cardiovascular: negative for chest pain Respiratory: negative for SOB or persistent cough Gastrointestinal: negative for abdominal pain Genitourinary: negative for dysuria or gross hematuria MSK: negative for foot lesions Neurologic: negative for weakness or gait disturbance  Objective  Vitals: BP 110/70 (BP Location: Left Arm, Patient Position: Sitting, Cuff Size: Large)   Pulse 68   Temp 97.9 F (36.6 C) (Temporal)   Ht 5\' 6"  (1.676 m)   Wt 195 lb (88.5 kg)   SpO2 97%   BMI 31.47 kg/m  General: well appearing, no acute distress  Psych:  Alert and oriented, anxious mood and affect     Diabetic education: ongoing education regarding chronic disease management for diabetes was given today. We continue to reinforce the ABC's of diabetic management: A1c (<7 or 8 dependent upon patient), tight blood pressure control, and cholesterol management with goal LDL < 100 minimally. We discuss diet strategies, exercise recommendations, medication options and possible side effects. At each visit, we review recommended immunizations and preventive care recommendations for diabetics and stress that good diabetic control can prevent other problems. See below for this patient's  data.    Commons side effects, risks, benefits, and alternatives for medications and treatment plan prescribed today were discussed, and the patient expressed understanding of the given instructions. Patient is instructed to call or message via MyChart if he/she has any questions or concerns regarding our treatment plan. No barriers to understanding were identified. We discussed Red Flag symptoms and signs in detail. Patient expressed understanding regarding what to do in case of urgent or emergency type symptoms.   Medication list was reconciled, printed and provided to the patient in AVS. Patient instructions and summary information was reviewed with the patient as documented in the AVS. This note was prepared with assistance of Dragon voice recognition software. Occasional wrong-word or sound-a-like substitutions may have  occurred due to the inherent limitations of voice recognition software  This visit occurred during the SARS-CoV-2 public health emergency.  Safety protocols were in place, including screening questions prior to the visit, additional usage of staff PPE, and extensive cleaning of exam room while observing appropriate contact time as indicated for disinfecting solutions.

## 2020-10-21 ENCOUNTER — Other Ambulatory Visit: Payer: Self-pay | Admitting: Podiatry

## 2020-10-23 NOTE — Telephone Encounter (Signed)
Please advise 

## 2020-11-04 ENCOUNTER — Other Ambulatory Visit: Payer: Self-pay | Admitting: Family Medicine

## 2020-11-06 ENCOUNTER — Encounter: Payer: Self-pay | Admitting: Family Medicine

## 2020-11-06 ENCOUNTER — Other Ambulatory Visit: Payer: Self-pay | Admitting: Family Medicine

## 2020-11-17 DIAGNOSIS — Z01419 Encounter for gynecological examination (general) (routine) without abnormal findings: Secondary | ICD-10-CM | POA: Diagnosis not present

## 2020-11-17 DIAGNOSIS — Z6831 Body mass index (BMI) 31.0-31.9, adult: Secondary | ICD-10-CM | POA: Diagnosis not present

## 2020-11-20 DIAGNOSIS — Z1231 Encounter for screening mammogram for malignant neoplasm of breast: Secondary | ICD-10-CM | POA: Diagnosis not present

## 2020-11-27 ENCOUNTER — Other Ambulatory Visit: Payer: Self-pay

## 2020-11-27 ENCOUNTER — Ambulatory Visit (INDEPENDENT_AMBULATORY_CARE_PROVIDER_SITE_OTHER): Payer: BC Managed Care – PPO | Admitting: Family Medicine

## 2020-11-27 ENCOUNTER — Encounter: Payer: Self-pay | Admitting: Family Medicine

## 2020-11-27 VITALS — BP 114/68 | HR 77 | Temp 98.2°F | Ht 66.0 in | Wt 195.4 lb

## 2020-11-27 DIAGNOSIS — Z9989 Dependence on other enabling machines and devices: Secondary | ICD-10-CM

## 2020-11-27 DIAGNOSIS — G4700 Insomnia, unspecified: Secondary | ICD-10-CM | POA: Diagnosis not present

## 2020-11-27 DIAGNOSIS — G4733 Obstructive sleep apnea (adult) (pediatric): Secondary | ICD-10-CM

## 2020-11-27 DIAGNOSIS — F411 Generalized anxiety disorder: Secondary | ICD-10-CM

## 2020-11-27 DIAGNOSIS — T887XXA Unspecified adverse effect of drug or medicament, initial encounter: Secondary | ICD-10-CM | POA: Diagnosis not present

## 2020-11-27 MED ORDER — BUSPIRONE HCL 30 MG PO TABS: 30.0000 mg | ORAL_TABLET | Freq: Two times a day (BID) | ORAL | 3 refills | Status: DC | PRN

## 2020-11-27 MED ORDER — PAROXETINE HCL 20 MG PO TABS: 20.0000 mg | ORAL_TABLET | Freq: Every day | ORAL | 3 refills | Status: DC

## 2020-11-27 NOTE — Progress Notes (Signed)
Subjective  CC:  Chief Complaint  Patient presents with  . Anxiety    6 week f/u, up to Paxil 20 mg - tolerating well, has noticed improvement     HPI: Crystal [name] is a 45 y.o. female who presents to the office today to address the problems listed above in the chief complaint, mood problems.   This is a 6-week follow-up after starting Paxil for generalized anxiety disorder.  Please see last note.  She had been suffering for 1 to 2 months due to progressive anxiety symptoms.  She was using Xanax frequently.  Since starting Paxil nightly, her symptoms are much improved.  No longer needing Xanax at all.  Only using BuSpar intermittently, in part due to forgetting during the day.  She denies panic symptoms.  Feels much more at ease.  No longer with chronic worry about loss or illness to the family.  Her only complaints are side effects from medication including decreased libido and sometimes feeling like she cannot quite sit still.  She feels the symptoms are mild and manageable.    Insomnia: She was struggling with worsening insomnia at the end of last year.  Since starting the Paxil, insomnia is mildly improved.  She is falling asleep more easily.  However she describes herself as a very light sleeper.  She awakens easily.  Also having some problems awakening in the middle the night having a harder time going back to sleep.  She has sleep apnea and uses her CPAP machine religiously.  She finds this very helpful.  She denies awakening from worry or stress.  She has no perimenopausal symptoms.  She has good sleep hygiene routine.   GAD 7 : Generalized Anxiety Score 11/27/2020  Nervous, Anxious, on Edge 1  Control/stop worrying 1  Worry too much - different things 1  Trouble relaxing 0  Restless 0  Easily annoyed or irritable 0  Afraid - awful might happen 1  Total GAD 7 Score 4  Anxiety Difficulty Somewhat difficult     Assessment  1. Generalized anxiety disorder    2. Insomnia, unspecified type   3. OSA on CPAP   4. Side effect of medication      Plan   Generalized anxiety disorder: Fortunately, she has had a great response to Paxil.  Refill Paxil 20 mg nightly.  Counseled regarding the appropriate use of BuSpar.  If daily anxiety symptoms worsen or persist, she can use BuSpar daily.  Recommend taking every morning when she brushes her teeth if needed.  Otherwise she could just use on an as-needed basis.  She will monitor.  She did find it helpful.  She is having mild side effects from the Paxil and will monitor these.  No changes made today.  Insomnia: Chronic but slightly worse.  Suspect still will improve as we continue to treat anxiety.  She will follow-up here if not improving over the next 6 to 12 weeks.  Sleep apnea: Continue CPAP  Reviewed concept of mood problems caused by biochemical imbalance of neurotransmitters and rationale for treatment with medications and therapy.   Counseling given: pt was instructed to contact office, on-call physician or crisis Hotline if symptoms worsen significantly. If patient develops any suicidal or homicidal thoughts, she is directed to the ER immediately.   Follow up: 3 to 6 months to recheck anxiety and sleep if needed No orders of the defined types were placed in this encounter.  Meds ordered this encounter  Medications  .  busPIRone (BUSPAR) 30 MG tablet    Sig: Take 1 tablet (30 mg total) by mouth 2 (two) times daily as needed.    Dispense:  180 tablet    Refill:  3  . PARoxetine (PAXIL) 20 MG tablet    Sig: Take 1 tablet (20 mg total) by mouth at bedtime.    Dispense:  90 tablet    Refill:  3      I reviewed the patients updated PMH, FH, and SocHx.    Patient Active Problem List   Diagnosis Date Noted  . Prediabetes 06/27/2020  . Mixed hyperlipidemia 06/27/2020  . Acne vulgaris 06/27/2020  . Family history of premature CAD 06/27/2020  . Gastroesophageal reflux disease without esophagitis  06/27/2020  . Generalized anxiety disorder 06/27/2020  . Seasonal allergic rhinitis due to pollen 06/27/2020  . Chronic diarrhea 06/27/2020  . OSA on CPAP    Current Meds  Medication Sig  . cetirizine (ZYRTEC) 10 MG tablet Take 10 mg by mouth daily.  . Iron Combinations (IRON COMPLEX PO) Take by mouth.  . meloxicam (MOBIC) 15 MG tablet TAKE 1 TABLET (15 MG TOTAL) BY MOUTH DAILY.  . pantoprazole (PROTONIX) 20 MG tablet Take 2 tablets (40 mg total) by mouth daily.  . pantoprazole (PROTONIX) 40 MG tablet TAKE 1-2 CAPSULES BY MOUTH DAILY FOR HEARTBURN  . spironolactone (ALDACTONE) 100 MG tablet Take 100 mg by mouth daily.  Marland Kitchen spironolactone (ALDACTONE) 50 MG tablet Take 50 mg by mouth daily.  . [DISCONTINUED] ALPRAZolam (XANAX) 0.25 MG tablet Take 0.25 mg by mouth at bedtime as needed for anxiety.  . [DISCONTINUED] busPIRone (BUSPAR) 30 MG tablet Take 1 tablet (30 mg total) by mouth 2 (two) times daily.  . [DISCONTINUED] PARoxetine (PAXIL) 10 MG tablet TAKE 1 TABLET BY MOUTH AT BEDTIME FOR 7 DAYS, THEN 2 TABLETS AT BEDTIME FOR 21 DAYS. (Patient taking differently: 20 mg.)    Allergies: Patient is allergic to other and sulfa antibiotics. Family history:  Patient family history includes Arthritis in her maternal grandmother and mother; COPD in her paternal grandmother; Colon cancer in her maternal grandfather; Depression in her daughter; Early death in her paternal grandfather; Healthy in her brother and sister; Heart disease in her paternal grandfather and paternal uncle; Heart disease (age of onset: 84) in her father; Hypertension in her mother; Kidney disease in her maternal grandmother. Social History   Socioeconomic History  . Marital status: Married    Spouse name: Wynona Canes  . Number of children: 5  . Years of education: Not on file  . Highest education level: Bachelor's degree (e.g., BA, AB, BS)  Occupational History  . 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 Smoking status: Never Smoker  . Smokeless tobacco: Never Used  Vaping Use  . Vaping Use: Never used  Substance and Sexual Activity  . Alcohol use: Yes    Alcohol/week: 2.0 standard drinks    Types: 1 Glasses of wine, 1 Cans of beer per week  . Drug use: Never  . Sexual activity: Yes    Partners: Female    Birth control/protection: Surgical  Other Topics Concern  . Not on file  Social History Narrative  . Not on file   Social Determinants of Health   Financial Resource Strain: Not on file  Food Insecurity: Not on file  Transportation Needs: Not on file  Physical Activity: Not on file  Stress: Not on file  Social  Connections: Not on file     Review of Systems: Constitutional: Negative for fever malaise or anorexia Cardiovascular: negative for chest pain Respiratory: negative for SOB or persistent cough Gastrointestinal: negative for abdominal pain  Objective  Vitals: BP 114/68   Pulse 77   Temp 98.2 F (36.8 C) (Temporal)   Ht 5\' 6"  (1.676 m)   Wt 195 lb 6.4 oz (88.6 kg)   SpO2 97%   BMI 31.54 kg/m  General: no acute distress, well appearing, no apparent distress, well groomed Psych:  Alert and oriented x 3,normal mood, behavior, speech, dress, and thought processes.  Good insight   Commons side effects, risks, benefits, and alternatives for medications and treatment plan prescribed today were discussed, and the patient expressed understanding of the given instructions. Patient is instructed to call or message via MyChart if he/she has any questions or concerns regarding our treatment plan. No barriers to understanding were identified. We discussed Red Flag symptoms and signs in detail. Patient expressed understanding regarding what to do in case of urgent or emergency type symptoms.   Medication list was reconciled, printed and provided to the patient in AVS. Patient instructions and summary information was reviewed with the patient as  documented in the AVS. This note was prepared with assistance of Dragon voice recognition software. Occasional wrong-word or sound-a-like substitutions may have occurred due to the inherent limitations of voice recognition software

## 2020-11-27 NOTE — Patient Instructions (Signed)
Please return in 3 months for mood recheck.   We will have your annual physicals in September.   I have refilled your paxil at 20mg  nightly.   If you have any questions or concerns, please don't hesitate to send me a message via MyChart or call the office at [phone]. Thank you for visiting with [phone] today! It's our pleasure caring for you.

## 2020-12-01 ENCOUNTER — Other Ambulatory Visit: Payer: Self-pay

## 2020-12-01 ENCOUNTER — Encounter: Payer: Self-pay | Admitting: Family Medicine

## 2020-12-28 ENCOUNTER — Encounter: Payer: Self-pay | Admitting: Family Medicine

## 2020-12-28 ENCOUNTER — Other Ambulatory Visit: Payer: Self-pay

## 2020-12-28 MED ORDER — PANTOPRAZOLE SODIUM 40 MG PO TBEC: 40.0000 mg | DELAYED_RELEASE_TABLET | Freq: Two times a day (BID) | ORAL | 3 refills | Status: DC

## 2021-01-05 ENCOUNTER — Other Ambulatory Visit: Payer: Self-pay | Admitting: Podiatry

## 2021-02-05 ENCOUNTER — Ambulatory Visit: Payer: BC Managed Care – PPO | Admitting: Podiatry

## 2021-02-19 ENCOUNTER — Ambulatory Visit: Payer: BC Managed Care – PPO | Admitting: Podiatry

## 2021-03-05 ENCOUNTER — Other Ambulatory Visit: Payer: Self-pay

## 2021-03-05 ENCOUNTER — Encounter: Payer: Self-pay | Admitting: Family Medicine

## 2021-03-05 ENCOUNTER — Ambulatory Visit (INDEPENDENT_AMBULATORY_CARE_PROVIDER_SITE_OTHER): Payer: BC Managed Care – PPO | Admitting: Family Medicine

## 2021-03-05 VITALS — BP 108/75 | HR 82 | Temp 97.8°F | Resp 15 | Wt 195.8 lb

## 2021-03-05 DIAGNOSIS — G4733 Obstructive sleep apnea (adult) (pediatric): Secondary | ICD-10-CM

## 2021-03-05 DIAGNOSIS — R7303 Prediabetes: Secondary | ICD-10-CM | POA: Diagnosis not present

## 2021-03-05 DIAGNOSIS — F411 Generalized anxiety disorder: Secondary | ICD-10-CM | POA: Diagnosis not present

## 2021-03-05 DIAGNOSIS — G4700 Insomnia, unspecified: Secondary | ICD-10-CM | POA: Diagnosis not present

## 2021-03-05 DIAGNOSIS — Z9989 Dependence on other enabling machines and devices: Secondary | ICD-10-CM

## 2021-03-05 DIAGNOSIS — Z8249 Family history of ischemic heart disease and other diseases of the circulatory system: Secondary | ICD-10-CM | POA: Diagnosis not present

## 2021-03-05 MED ORDER — PAROXETINE HCL 20 MG PO TABS: 20.0000 mg | ORAL_TABLET | Freq: Every day | ORAL | 3 refills | Status: DC
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
## 2021-03-05 NOTE — Patient Instructions (Signed)
Please return in 6 months for your annual complete physical; please come fasting.   Let me know if you need more help with sleep due to the paxil after changing it to the morning. We have several options. Hopefully the exercise will help as well.   If you have any questions or concerns, please don't hesitate to send me a message via MyChart or call the office at [phone]. Thank you for visiting with Crystal [name] today! It's our pleasure caring for you.

## 2021-03-31 ENCOUNTER — Encounter: Payer: Self-pay | Admitting: Family Medicine

## 2021-03-31 DIAGNOSIS — Z8616 Personal history of COVID-19: Secondary | ICD-10-CM

## 2021-03-31 DIAGNOSIS — Z20822 Contact with and (suspected) exposure to covid-19: Secondary | ICD-10-CM | POA: Diagnosis not present

## 2021-03-31 HISTORY — DX: Personal history of COVID-19: Z86.16

## 2021-04-01 DIAGNOSIS — U071 COVID-19: Secondary | ICD-10-CM | POA: Diagnosis not present

## 2021-04-01 DIAGNOSIS — R0602 Shortness of breath: Secondary | ICD-10-CM | POA: Diagnosis not present

## 2021-04-01 DIAGNOSIS — R0789 Other chest pain: Secondary | ICD-10-CM | POA: Diagnosis not present

## 2021-04-01 DIAGNOSIS — J029 Acute pharyngitis, unspecified: Secondary | ICD-10-CM | POA: Diagnosis not present

## 2021-04-01 DIAGNOSIS — R079 Chest pain, unspecified: Secondary | ICD-10-CM | POA: Diagnosis not present

## 2021-04-01 DIAGNOSIS — R059 Cough, unspecified: Secondary | ICD-10-CM | POA: Diagnosis not present

## 2021-04-02 ENCOUNTER — Telehealth: Payer: Self-pay

## 2021-04-02 DIAGNOSIS — R9431 Abnormal electrocardiogram [ECG] [EKG]: Secondary | ICD-10-CM | POA: Diagnosis not present

## 2021-04-02 NOTE — Telephone Encounter (Signed)
FYI, patient did not go to UC/ED

## 2021-04-02 NOTE — Telephone Encounter (Signed)
Dr. Mardelle Matte, please see message and review records under Care Everywhere. Please advise where to schedule pt for follow up, you have a same day slot on Wed at 3:00 is that okay?

## 2021-04-02 NOTE — Telephone Encounter (Signed)
Patient Name: Crystal [name] [id number] Gender: Female DOB: 09-29-1975 Age: 45 Y 6 M 23 D Return Phone Number: [phone] (Primary) Address: City/ State/ Zip: [place]  [address] Client [facility] Night - [place] Client Site [facility] Night Physician Asencion Partridge- MD Contact Type Call Who Is Calling Patient / Member / Family / Caregiver Call Type Triage / Clinical Relationship To Patient Self Return Phone Number [phone] (Primary) Chief Complaint BREATHING - shortness of breath or sounds breathless Reason for Call Symptomatic / Request for Health Information Initial Comment Caller has Covid hard to breath oxygen 91. Has Chest pain thinks its due to the cough Translation No Nurse Assessment Nurse: Muminovic, RN, Fleet Contras Date/Time (Eastern Time): 04/01/2021 4:13:19 PM Confirm and document reason for call. If symptomatic, describe symptoms. ---Caller states she is tested positive for COVID on Friday. Feels tightness on chest and O2- 91-92. CPAP brings it up to 94. Tightness when breathing Does the patient have any new or worsening symptoms? ---Yes Will a triage be completed? ---Yes Related visit to physician within the last 2 weeks? ---No Does the PT have any chronic conditions? (i.e. diabetes, asthma, this includes High risk factors for pregnancy, etc.) ---Yes List chronic conditions. ---High cholesterol, diabetes Is the patient pregnant or possibly pregnant? (Ask all females between the ages of 80-55) ---No Is this a behavioral health or substance abuse call? ---No Guidelines Guideline Title Affirmed Question Affirmed Notes Nurse Date/Time (Eastern Time) COVID-19 - Diagnosed or Suspected MODERATE difficulty breathing (e.g., speaks in phrases, SOB even at rest, pulse 100-120) Muminovic, RN, Fleet Contras 04/01/2021 4:16:20 PM PLEASE NOTE: All timestamps contained within this report are represented as [place]  Standard Time. CONFIDENTIALTY NOTICE: This fax transmission is intended only for the addressee. It contains information that is legally privileged, confidential or otherwise protected from use or disclosure. If you are not the intended recipient, you are strictly prohibited from reviewing, disclosing, copying using or disseminating any of this information or taking any action in reliance on or regarding this information. If you have received this fax in error, please notify us immediately by telephone so that we can arrange for its return to [place]. Phone: [phone], Toll-Free: [phone], Fax: [phone] Page: 2 of 2 Call Id: [id number] Disp. Time Lamount Cohen Time) Disposition Final User 04/01/2021 4:12:25 PM Send to Urgent Queue Lynett Fish 04/01/2021 4:21:15 PM Go to ED Now Yes Muminovic, RN, Abbott Pao Disagree/Comply Comply Caller Understands Yes PreDisposition InappropriateToAsk Care Advice Given Per Guideline GO TO ED NOW: * You need to be seen in the Emergency Department. * Go to the ED at ___________ Hospital. * Leave now. Drive carefully. CARE ADVICE given per COVID-19 - DIAGNOSED OR SUSPECTED (Adult) guideline. CALL EMS 911 IF: * Severe difficulty breathing occurs Referrals [facility] - ED

## 2021-04-02 NOTE — Telephone Encounter (Signed)
Left message on voicemail to call office. Following up t see if pt went to [facility] or ED.

## 2021-05-08 ENCOUNTER — Other Ambulatory Visit: Payer: Self-pay | Admitting: Podiatry

## 2021-05-08 ENCOUNTER — Telehealth: Payer: Self-pay

## 2021-05-08 NOTE — Telephone Encounter (Signed)
Should I direct her to the health department?

## 2021-05-08 NOTE — Telephone Encounter (Signed)
Patient is aware to contact health department

## 2021-05-08 NOTE — Telephone Encounter (Signed)
Pt called in and requested some vaccines. Pt is traveling to [place] and [place] for work. She needs yellow fever and the typhoid vaccine. Can we do the vaccines? Please Advise.

## 2021-05-18 ENCOUNTER — Telehealth: Payer: Self-pay

## 2021-05-18 NOTE — Telephone Encounter (Signed)
Danielle please ask Francena Hanly if she can do this before she goes. Thanks

## 2021-05-18 NOTE — Telephone Encounter (Signed)
Patient is calling in stating that she is needing a copy of her immunizations, as she has to travel out of the country for work purposes. Wondering if she can get a copy today if at all possible as she has to turn it in on Tuesday.

## 2021-05-18 NOTE — Telephone Encounter (Signed)
Called and spoke with pt and made aware immunizations  printed and ready for pick up.

## 2021-05-24 ENCOUNTER — Ambulatory Visit (INDEPENDENT_AMBULATORY_CARE_PROVIDER_SITE_OTHER): Payer: BC Managed Care – PPO | Admitting: Pulmonary Disease

## 2021-05-24 ENCOUNTER — Other Ambulatory Visit: Payer: Self-pay

## 2021-05-24 ENCOUNTER — Encounter: Payer: Self-pay | Admitting: Pulmonary Disease

## 2021-05-24 VITALS — BP 116/76 | HR 75 | Ht 66.0 in | Wt 196.2 lb

## 2021-05-24 DIAGNOSIS — G4733 Obstructive sleep apnea (adult) (pediatric): Secondary | ICD-10-CM

## 2021-05-24 DIAGNOSIS — Z9989 Dependence on other enabling machines and devices: Secondary | ICD-10-CM

## 2021-05-24 NOTE — Progress Notes (Signed)
Crystal [name] Chistine Dematteo    [id number]    1976/03/27  Primary Care Physician:Andy, Malachi Bonds, MD  Referring Physician: Willow Ora, MD [address] [place],  [place] [address]  Chief complaint:   Patient with a history of obstructive sleep apnea  HPI:  Relocated from [place]  Recent COVID infection in July  Diagnosed about 11 years ago As you CPAP on a regular basis Tolerates CPAP well  Machine is becoming noisy air, some rainout  Takes a little while to fall asleep up to an hour Wakes up frequently  final wake up time about 6 AM  Has lost a good amount of weight recently-weight down about 20 pounds  History of hypercholesterolemia  Never smoker Social alcohol use  Outpatient Encounter Medications as of 05/24/2021  Medication Sig   busPIRone (BUSPAR) 30 MG tablet Take 1 tablet (30 mg total) by mouth 2 (two) times daily as needed.   cetirizine (ZYRTEC) 10 MG tablet Take 10 mg by mouth daily.   Iron Combinations (IRON COMPLEX PO) Take by mouth.   pantoprazole (PROTONIX) 40 MG tablet Take 1 tablet (40 mg total) by mouth 2 (two) times daily.   PARoxetine (PAXIL) 20 MG tablet Take 1 tablet (20 mg total) by mouth daily.   spironolactone (ALDACTONE) 100 MG tablet Take 100 mg by mouth daily.   spironolactone (ALDACTONE) 50 MG tablet Take 50 mg by mouth daily.   [DISCONTINUED] meloxicam (MOBIC) 15 MG tablet TAKE 1 TABLET (15 MG TOTAL) BY MOUTH DAILY. (Patient not taking: Reported on 05/24/2021)   No facility-administered encounter medications on file as of 05/24/2021.    Allergies as of 05/24/2021 - Review Complete 05/24/2021  Allergen Reaction Noted   Sulfa antibiotics Hives 06/27/2020    Past Medical History:  Diagnosis Date   Allergy    Anxiety    GERD (gastroesophageal reflux disease)    Hyperlipidemia    OSA on CPAP    Prediabetes    Sleep apnea    Thyroid disease     Past Surgical History:  Procedure Laterality Date    BILATERAL CARPAL TUNNEL RELEASE  2001   CHOLECYSTECTOMY     knee surgery right  2013   PARTIAL HYSTERECTOMY  2017   menorrhagia   SHOULDER ARTHROSCOPY Right 2010, 2012    Family History  Problem Relation Age of Onset   Arthritis Mother    Hypertension Mother    Heart disease Father 62   Healthy Sister    Healthy Brother    Depression Daughter    Heart disease Paternal Uncle    Arthritis Maternal Grandmother    Kidney disease Maternal Grandmother    Colon cancer Maternal Grandfather    COPD Paternal Grandmother    Heart disease Paternal Grandfather        died in 30s   Early death Paternal Grandfather     Social History   Socioeconomic History   Marital status: Married    Spouse name: Wynona Canes   Number of children: 5   Years of education: Not on file   Highest education level: Bachelor's degree (e.g., BA, AB, BS)  Occupational History   Occupation: head of technical training    Comment: [employer]   Tobacco Use   Smoking status: Never   Smokeless tobacco: Never  Vaping Use   Vaping Use: Never used  Substance and Sexual Activity   Alcohol use: Yes    Alcohol/week: 2.0 standard  drinks    Types: 1 Glasses of wine, 1 Cans of beer per week   Drug use: Never   Sexual activity: Yes    Partners: Female    Birth control/protection: Surgical  Other Topics Concern   Not on file  Social History Narrative   Not on file   Social Determinants of Health   Financial Resource Strain: Not on file  Food Insecurity: Not on file  Transportation Needs: Not on file  Physical Activity: Not on file  Stress: Not on file  Social Connections: Not on file  Intimate Partner Violence: Not on file    Review of Systems  Constitutional:  Negative for fever.  Respiratory:  Negative for shortness of breath.   Psychiatric/Behavioral:  Positive for sleep disturbance.    Vitals:   05/24/21 1409  BP: 116/76  Pulse: 75  SpO2: 98%     Physical Exam Constitutional:       Appearance: She is obese.  HENT:     Head: Normocephalic.     Mouth/Throat:     Mouth: Mucous membranes are moist.     Comments: Mallampati 2, Cardiovascular:     Rate and Rhythm: Normal rate and regular rhythm.     Heart sounds: No murmur heard.   No friction rub.  Pulmonary:     Effort: No respiratory distress.     Breath sounds: No stridor. No wheezing or rhonchi.  Musculoskeletal:     Cervical back: No rigidity or tenderness.  Neurological:     Mental Status: She is alert.  Psychiatric:        Mood and Affect: Mood normal.   Epworth Sleepiness Scale of 4  Data Reviewed: Compliance data shows 100% compliance with CPAP Set at 8 No significant leak AHI 1.4  Assessment:  History of obstructive sleep apnea -Compliant with CPAP -Machine is becoming dysfunctional  Obesity  Recent COVID infection -Recovered fully  History of GERD  Plan/Recommendations: Schedule patient for a home sleep study  Pathophysiology of sleep disordered breathing reviewed Treatment options reviewed  Tentative follow-up in 3 months   Virl Diamond MD [facility] 05/24/2021, 2:57 PM  CC: Willow Ora, MD

## 2021-05-24 NOTE — Patient Instructions (Signed)
History of obstructive sleep apnea  Schedule you for home sleep study We will update your results as soon as reviewed  Contact the medical supply company for CPAP set up following study  I will see you back in about 3 months  Call with any significant concerns  Sleep Apnea Sleep apnea affects breathing during sleep. It causes breathing to stop for 10 seconds or more, or to become shallow. People with sleep apnea usually snore loudly. It can also increase the risk of: Heart attack. Stroke. Being very overweight (obese). Diabetes. Heart failure. Irregular heartbeat. High blood pressure. The goal of treatment is to help you breathe normally again. What are the causes? The most common cause of this condition is a collapsed or blocked airway. There are three kinds of sleep apnea: Obstructive sleep apnea. This is caused by a blocked or collapsed airway. Central sleep apnea. This happens when the brain does not send the right signals to the muscles that control breathing. Mixed sleep apnea. This is a combination of obstructive and central sleep apnea. What increases the risk? Being overweight. Smoking. Having a small airway. Being older. Being female. Drinking alcohol. Taking medicines to calm yourself (sedatives or tranquilizers). Having family members with the condition. Having a tongue or tonsils that are larger than normal. What are the signs or symptoms? Trouble staying asleep. Loud snoring. Headaches in the morning. Waking up gasping. Dry mouth or sore throat in the morning. Being sleepy or tired during the day. If you are sleepy or tired during the day, you may also: Not be able to focus your mind (concentrate). Forget things. Get angry a lot and have mood swings. Feel sad (depressed). Have changes in your personality. Have less interest in sex, if you are female. Be unable to have an erection, if you are female. How is this treated?  Sleeping on your side. Using a  medicine to get rid of mucus in your nose (decongestant). Avoiding the use of alcohol, medicines to help you relax, or certain pain medicines (narcotics). Losing weight, if needed. Changing your diet. Quitting smoking. Using a machine to open your airway while you sleep, such as: An oral appliance. This is a mouthpiece that shifts your lower jaw forward. A CPAP device. This device blows air through a mask when you breathe out (exhale). An EPAP device. This has valves that you put in each nostril. A BPAP device. This device blows air through a mask when you breathe in (inhale) and breathe out. Having surgery if other treatments do not work. Follow these instructions at home: Lifestyle Make changes that your doctor recommends. Eat a healthy diet. Lose weight if needed. Avoid alcohol, medicines to help you relax, and some pain medicines. Do not smoke or use any products that contain nicotine or tobacco. If you need help quitting, ask your doctor. General instructions Take over-the-counter and prescription medicines only as told by your doctor. If you were given a machine to use while you sleep, use it only as told by your doctor. If you are having surgery, make sure to tell your doctor you have sleep apnea. You may need to bring your device with you. Keep all follow-up visits. Contact a doctor if: The machine that you were given to use during sleep bothers you or does not seem to be working. You do not get better. You get worse. Get help right away if: Your chest hurts. You have trouble breathing in enough air. You have an uncomfortable feeling in your  back, arms, or stomach. You have trouble talking. One side of your body feels weak. A part of your face is hanging down. These symptoms may be an emergency. Get help right away. Call your local emergency services (911 in the [place] Do not wait to see if the symptoms will go away. Do not drive yourself to the hospital. Summary This  condition affects breathing during sleep. The most common cause is a collapsed or blocked airway. The goal of treatment is to help you breathe normally while you sleep. This information is not intended to replace advice given to you by your health care provider. Make sure you discuss any questions you have with your health care provider. Document Revised: 08/11/2020 Document Reviewed: 08/11/2020 Elsevier Patient Education  2022 [employer].

## 2021-08-17 ENCOUNTER — Other Ambulatory Visit: Payer: Self-pay

## 2021-08-17 ENCOUNTER — Ambulatory Visit: Payer: BC Managed Care – PPO

## 2021-08-17 DIAGNOSIS — G4733 Obstructive sleep apnea (adult) (pediatric): Secondary | ICD-10-CM

## 2021-08-17 DIAGNOSIS — Z9989 Dependence on other enabling machines and devices: Secondary | ICD-10-CM

## 2021-08-30 ENCOUNTER — Telehealth: Payer: Self-pay | Admitting: Pulmonary Disease

## 2021-08-31 NOTE — Telephone Encounter (Signed)
Patient returning Margie's call.  ADvised her that her sleep study had not been read yet and that I would send a message to Dr. Ander Slade that she is calling for the results.  Dr. Ander Slade, Patient would like to get the results asap before the end of the year while their deductible is met.  Please advise.  Thank you.

## 2021-08-31 NOTE — Telephone Encounter (Signed)
Lm for patient.  

## 2021-09-01 ENCOUNTER — Telehealth: Payer: Self-pay | Admitting: Pulmonary Disease

## 2021-09-01 ENCOUNTER — Encounter: Payer: Self-pay | Admitting: Pulmonary Disease

## 2021-09-01 DIAGNOSIS — G4733 Obstructive sleep apnea (adult) (pediatric): Secondary | ICD-10-CM | POA: Diagnosis not present

## 2021-09-01 NOTE — Telephone Encounter (Signed)
Call patient  Sleep study result  Date of study: 08/17/2021  Impression: Mild obstructive sleep apnea Mild oxygen desaturations  Recommendation:  Continue CPAP therapy for mild obstructive sleep apnea  May continue with current setting of 8 or consider auto CPAP 5-15  Will require DME referral to upgrade machine  Other options of treatment for mild obstructive sleep apnea may be an oral device  Close clinical follow up with compliance monitoring to optimize therapeutic efficiency  Weight loss measures encouraged  She should be cautioned against driving when sleepy & against medications with sedative side effects

## 2021-09-03 NOTE — Telephone Encounter (Signed)
I called and left a brief message for the patient to call back.

## 2021-09-03 NOTE — Telephone Encounter (Signed)
AO, can you please advise about her results? Thanks!

## 2021-09-03 NOTE — Telephone Encounter (Signed)
I called and the patient has seen results on Mychart and she is agreeable to a new machine. I am placing the new order. Patient is aware to call back after having 60 days with data to come in the office for a compliance check. Nothing further needed.

## 2021-09-05 ENCOUNTER — Encounter: Payer: Self-pay | Admitting: Family Medicine

## 2021-09-05 ENCOUNTER — Ambulatory Visit (INDEPENDENT_AMBULATORY_CARE_PROVIDER_SITE_OTHER): Payer: BC Managed Care – PPO | Admitting: Family Medicine

## 2021-09-05 VITALS — BP 100/60 | HR 80 | Temp 97.6°F | Ht 66.0 in | Wt 194.8 lb

## 2021-09-05 DIAGNOSIS — R7303 Prediabetes: Secondary | ICD-10-CM | POA: Diagnosis not present

## 2021-09-05 DIAGNOSIS — G4733 Obstructive sleep apnea (adult) (pediatric): Secondary | ICD-10-CM | POA: Diagnosis not present

## 2021-09-05 DIAGNOSIS — F5101 Primary insomnia: Secondary | ICD-10-CM | POA: Diagnosis not present

## 2021-09-05 DIAGNOSIS — Z0000 Encounter for general adult medical examination without abnormal findings: Secondary | ICD-10-CM

## 2021-09-05 DIAGNOSIS — F411 Generalized anxiety disorder: Secondary | ICD-10-CM

## 2021-09-05 DIAGNOSIS — Z9989 Dependence on other enabling machines and devices: Secondary | ICD-10-CM

## 2021-09-05 DIAGNOSIS — Z79899 Other long term (current) drug therapy: Secondary | ICD-10-CM

## 2021-09-05 DIAGNOSIS — E782 Mixed hyperlipidemia: Secondary | ICD-10-CM

## 2021-09-05 DIAGNOSIS — K219 Gastro-esophageal reflux disease without esophagitis: Secondary | ICD-10-CM

## 2021-09-05 DIAGNOSIS — E669 Obesity, unspecified: Secondary | ICD-10-CM

## 2021-09-05 LAB — POCT GLYCOSYLATED HEMOGLOBIN (HGB A1C): Hemoglobin A1C: 5.7 % — AB (ref 4.0–5.6)

## 2021-09-05 MED ORDER — SPIRONOLACTONE 50 MG PO TABS: 50.0000 mg | ORAL_TABLET | Freq: Every day | ORAL | 3 refills | Status: AC

## 2021-09-05 MED ORDER — SIMVASTATIN 20 MG PO TABS: 20.0000 mg | ORAL_TABLET | Freq: Every day | ORAL | 3 refills | Status: AC

## 2021-09-05 MED ORDER — SPIRONOLACTONE 100 MG PO TABS: 100.0000 mg | ORAL_TABLET | Freq: Every day | ORAL | 3 refills | Status: DC

## 2021-09-05 NOTE — Patient Instructions (Signed)
Please return in 12 months for your annual complete physical; please come fasting.  Please schedule lab visit for fasting lab work.  You are not currently eligible for the Prevnar vaccination.  Your prediabetes has now normalized.  Continue with active exercise, healthy diet and weight loss.  If you have any questions or concerns, please don't hesitate to send me a message via MyChart or call the office at [phone]. Thank you for visiting with [place] today! It's our pleasure caring for you.   Please do these things to maintain good health!  Exercise at least 30-45 minutes a day,  4-5 days a week.  Eat a low-fat diet with lots of fruits and vegetables, up to 7-9 servings per day. Drink plenty of water daily. Try to drink 8 8oz glasses per day. Seatbelts can save your life. Always wear your seatbelt. Place Smoke Detectors on every level of your home and check batteries every year. Schedule an appointment with an eye doctor for an eye exam every 1-2 years Safe sex - use condoms to protect yourself from STDs if you could be exposed to these types of infections. Use birth control if you do not want to become pregnant and are sexually active. Avoid heavy alcohol use. If you drink, keep it to less than 2 drinks/day and not every day. Health Care Power of Attorney.  Choose someone you trust that could speak for you if you became unable to speak for yourself. Depression is common in our stressful world.If you're feeling down or losing interest in things you normally enjoy, please come in for a visit. If anyone is threatening or hurting you, please get help. Physical or Emotional Violence is never OK.

## 2021-09-05 NOTE — Progress Notes (Signed)
Subjective  Chief Complaint  Patient presents with   Annual Exam    Fasting   Anxiety   Gastroesophageal Reflux   Diabetes    HPI: Crystal [name] is a 45 y.o. female who presents to [facility] [place] today for a Female Wellness Visit. She also has the concerns and/or needs as listed above in the chief complaint. These will be addressed in addition to the Health Maintenance Visit.   Wellness Visit: annual visit with health maintenance review and exam without Pap  Health maintenance: [facility].  Mammogram up-to-date.  Pap smear not indicated due to partial hysterectomy history.  Overall feeling well.  Needs to get back to exercise.  Eye exam up-to-date.  Immunizations up-to-date. Chronic disease f/u and/or acute problem visit: (deemed necessary to be done in addition to the wellness visit): Prediabetes/impaired fasting glucose: No symptoms of hypoglycemia.  Trying to follow a healthy diet.  Exercises on her Velora Heckler but has been less consistent recently due to travel.  Due for recheck Anxiety disorder and insomnia: Both are improved.  Continues Paxil 20 mg daily.  No longer needing BuSpar.  Mood is at baseline.  Feels good overall.  Still occasionally has problems sleeping at night.  Uses CPAP machine just had sleep study and sleep apnea is now mild.  She feels good about this. Hyperlipidemia on simvastatin well-tolerated. Chronic GERD even with high-dose twice daily Protonix.  Will have breakthrough symptoms if he eats spicy foods or citrusy foods.  No melena.  Does have reflux and endoscopy years ago.  Given her breakthrough symptoms she would like to see gastroenterology.  Assessment  1. Annual physical exam   2. Prediabetes   3. Generalized anxiety disorder   4. Primary insomnia   5. OSA on CPAP   6. Mixed hyperlipidemia   7. Long-term current use of proton pump inhibitor therapy   8. Gastroesophageal reflux disease  without esophagitis   9. Obesity (BMI 30-39.9)      Plan  Female Wellness Visit: Age appropriate Health Maintenance and Prevention measures were discussed with patient. Included topics are cancer screening recommendations, ways to keep healthy (see AVS) including dietary and exercise recommendations, regular eye and dental care, use of seat belts, and avoidance of moderate alcohol use and tobacco use.  Rec follow-up with GYN and mammogram due next year. BMI: discussed patient's BMI and encouraged positive lifestyle modifications to help get to or maintain a target BMI. HM needs and immunizations were addressed and ordered. See below for orders. See HM and immunization section for updates.  Current eligible for fourth COVID booster Routine labs and screening tests ordered including cmp, cbc and lipids where appropriate. Discussed recommendations regarding Vit D and calcium supplementation (see AVS)  Chronic disease management visit and/or acute problem visit: Prediabetes: A1c has normalized.  Continue healthy diet and exercise. Anxiety disorder is well controlled continue Paxil 20 mg nightly.  Stop BuSpar since no longer needing it. Insomnia and sleep apnea: Reviewed CPAP.  Stable Hyperlipidemia on simvastatin refilled 20 mg daily.  Recheck lipids.  She will return for fasting panel GERD: Refer to gastroenterology to consider endoscopy.  Continue Protonix 40 twice daily.  Screen for B12 deficiency  Follow up:  12 months for complete physical Orders Placed This Encounter  Procedures   CBC with Differential/Platelet   Comprehensive metabolic panel   Hepatitis C antibody   HIV Antibody (routine testing w rflx)   Lipid  panel   TSH   Vitamin B12   Ambulatory referral to Gastroenterology   POCT HgB A1C   Meds ordered this encounter  Medications   spironolactone (ALDACTONE) 100 MG tablet    Sig: Take 1 tablet (100 mg total) by mouth daily. Take with 50mg  dose for total 150mg /day     Dispense:  90 tablet    Refill:  3   spironolactone (ALDACTONE) 50 MG tablet    Sig: Take 1 tablet (50 mg total) by mouth daily. Take with 100mg  dose for total 150mg /day    Dispense:  90 tablet    Refill:  3      Body mass index is 31.44 kg/m. Wt Readings from Last 3 Encounters:  09/05/21 194 lb 12.8 oz (88.4 kg)  05/24/21 196 lb 3.2 oz (89 kg)  03/05/21 195 lb 12.8 oz (88.8 kg)     Patient Active Problem List   Diagnosis Date Noted   Prediabetes 06/27/2020    Priority: High   Mixed hyperlipidemia 06/27/2020    Priority: High   Family history of premature CAD 06/27/2020    Priority: High   Generalized anxiety disorder 06/27/2020    Priority: High   OSA on CPAP     Priority: High   Insomnia 03/05/2021    Priority: Medium    Gastroesophageal reflux disease without esophagitis 06/27/2020    Priority: Medium    Chronic diarrhea 06/27/2020    Priority: Medium    Acne vulgaris 06/27/2020    Priority: Low   Seasonal allergic rhinitis due to pollen 06/27/2020    Priority: [place] Maintenance  Topic Date Due   HIV Screening  Never done   Hepatitis C Screening  Never done   COVID-19 Vaccine (4 - Booster for Moderna series) 10/20/2020   MAMMOGRAM  11/20/2021   TETANUS/TDAP  05/25/2031   INFLUENZA VACCINE  Completed   HPV VACCINES  Aged Out   Pneumococcal Vaccine 76-45 Years old  Discontinued   PAP SMEAR-Modifier  Discontinued   Immunization History  Administered Date(s) Administered   Influenza Split 07/27/2012, 06/14/2013, 07/20/2014   Influenza,inj,Quad PF,6+ Mos 09/18/2020   Influenza,inj,quad, With Preservative 07/20/2014   Influenza-Unspecified 05/30/2021   Moderna Sars-Covid-2 Vaccination 11/24/2019, 12/21/2019   PFIZER(Purple Top)SARS-COV-2 Vaccination 08/25/2020   Tdap 04/14/2009   Tetanus 05/24/2021   We updated and reviewed the patient's past history in detail and it is documented below. Allergies: Patient is allergic to sulfa antibiotics. Past  Medical History Patient  has a past medical history of Allergy, Anxiety, GERD (gastroesophageal reflux disease), Hyperlipidemia, OSA on CPAP, Prediabetes, Sleep apnea, and Thyroid disease. Past Surgical History Patient  has a past surgical history that includes Bilateral carpal tunnel release (2001); Cholecystectomy; Shoulder arthroscopy (Right, 2010, 2012); knee surgery right (2013); and Partial hysterectomy (2017). Family History: Patient family history includes Arthritis in her maternal grandmother and mother; COPD in her paternal grandmother; Colon cancer in her maternal grandfather; Depression in her daughter; Early death in her paternal grandfather; Healthy in her brother and sister; Heart disease in her paternal grandfather and paternal uncle; Heart disease (age of onset: 46) in her father; Hypertension in her mother; Kidney disease in her maternal grandmother. Social History:  Patient  reports that she has never smoked. She has never used smokeless tobacco. She reports current alcohol use of about 2.0 standard drinks per week. She reports that she does not use drugs.  Review of Systems: Constitutional: negative for fever or malaise Ophthalmic: negative for  photophobia, double vision or loss of vision Cardiovascular: negative for chest pain, dyspnea on exertion, or new LE swelling Respiratory: negative for SOB or persistent cough Gastrointestinal: negative for abdominal pain, change in bowel habits or melena Genitourinary: negative for dysuria or gross hematuria, no abnormal uterine bleeding or disharge Musculoskeletal: negative for new gait disturbance or muscular weakness Integumentary: negative for new or persistent rashes, no breast lumps Neurological: negative for TIA or stroke symptoms Psychiatric: negative for SI or delusions Allergic/Immunologic: negative for hives  Patient Care Team    Relationship Specialty Notifications Start End  Willow Ora, MD PCP - General Family  Medicine  06/27/20   Ginette Otto, [facility]    09/05/21     Objective  Vitals: BP 100/60    Pulse 80    Temp 97.6 F (36.4 C) (Temporal)    Ht 5\' 6"  (1.676 m)    Wt 194 lb 12.8 oz (88.4 kg)    SpO2 99%    BMI 31.44 kg/m  General:  Well developed, well nourished, no acute distress  Psych:  Alert and orientedx3,normal mood and affect HEENT:  Normocephalic, atraumatic, non-icteric sclera,  supple neck without adenopathy, mass or thyromegaly Cardiovascular:  Normal S1, S2, RRR without gallop, rub or murmur Respiratory:  Good breath sounds bilaterally, CTAB with normal respiratory effort Gastrointestinal: normal bowel sounds, soft, non-tender, no noted masses. No HSM MSK: no deformities, contusions. Joints are without erythema or swelling.  Skin:  Warm, no rashes or suspicious lesions noted Neurologic:    Mental status is normal. CN 2-11 are normal. Gross motor and sensory exams are normal. Normal gait. No tremor   Commons side effects, risks, benefits, and alternatives for medications and treatment plan prescribed today were discussed, and the patient expressed understanding of the given instructions. Patient is instructed to call or message via MyChart if he/she has any questions or concerns regarding our treatment plan. No barriers to understanding were identified. We discussed Red Flag symptoms and signs in detail. Patient expressed understanding regarding what to do in case of urgent or emergency type symptoms.  Medication list was reconciled, printed and provided to the patient in AVS. Patient instructions and summary information was reviewed with the patient as documented in the AVS. This note was prepared with assistance of Dragon voice recognition software. Occasional wrong-word or sound-a-like substitutions may have occurred due to the inherent limitations of voice recognition software  This visit occurred during the SARS-CoV-2 public health emergency.  Safety protocols were  in place, including screening questions prior to the visit, additional usage of staff PPE, and extensive cleaning of exam room while observing appropriate contact time as indicated for disinfecting solutions.

## 2021-09-12 ENCOUNTER — Other Ambulatory Visit (INDEPENDENT_AMBULATORY_CARE_PROVIDER_SITE_OTHER): Payer: BC Managed Care – PPO

## 2021-09-12 DIAGNOSIS — E782 Mixed hyperlipidemia: Secondary | ICD-10-CM

## 2021-09-12 DIAGNOSIS — Z0000 Encounter for general adult medical examination without abnormal findings: Secondary | ICD-10-CM | POA: Diagnosis not present

## 2021-09-12 DIAGNOSIS — G4733 Obstructive sleep apnea (adult) (pediatric): Secondary | ICD-10-CM | POA: Diagnosis not present

## 2021-09-12 DIAGNOSIS — Z79899 Other long term (current) drug therapy: Secondary | ICD-10-CM | POA: Diagnosis not present

## 2021-09-12 LAB — LIPID PANEL
Cholesterol: 163 mg/dL (ref 0–200)
HDL: 35.4 mg/dL — ABNORMAL LOW (ref >39.00)
LDL Cholesterol: 102 mg/dL — ABNORMAL HIGH (ref 0–99)
NonHDL: 128.06
Total CHOL/HDL Ratio: 5
Triglycerides: 130 mg/dL (ref 0.0–149.0)
VLDL: 26 mg/dL (ref 0.0–40.0)

## 2021-09-12 LAB — CBC WITH DIFFERENTIAL/PLATELET
Basophils Absolute: 0 10*3/uL (ref 0.0–0.1)
Basophils Relative: 0.3 % (ref 0.0–3.0)
Eosinophils Absolute: 0.1 10*3/uL (ref 0.0–0.7)
Eosinophils Relative: 2.8 % (ref 0.0–5.0)
HCT: 38.4 % (ref 36.0–46.0)
Hemoglobin: 13.3 g/dL (ref 12.0–15.0)
Lymphocytes Relative: 38.4 % (ref 12.0–46.0)
Lymphs Abs: 2 10*3/uL (ref 0.7–4.0)
MCHC: 34.5 g/dL (ref 30.0–36.0)
MCV: 91.8 fl (ref 78.0–100.0)
Monocytes Absolute: 0.5 10*3/uL (ref 0.1–1.0)
Monocytes Relative: 9 % (ref 3.0–12.0)
Neutro Abs: 2.6 10*3/uL (ref 1.4–7.7)
Neutrophils Relative %: 49.5 % (ref 43.0–77.0)
Platelets: 284 10*3/uL (ref 150.0–400.0)
RBC: 4.18 Mil/uL (ref 3.87–5.11)
RDW: 13.2 % (ref 11.5–15.5)
WBC: 5.2 10*3/uL (ref 4.0–10.5)

## 2021-09-12 LAB — COMPREHENSIVE METABOLIC PANEL
ALT: 24 U/L (ref 0–35)
AST: 16 U/L (ref 0–37)
Albumin: 4.3 g/dL (ref 3.5–5.2)
Alkaline Phosphatase: 40 U/L (ref 39–117)
BUN: 15 mg/dL (ref 6–23)
CO2: 25 mEq/L (ref 19–32)
Calcium: 8.9 mg/dL (ref 8.4–10.5)
Chloride: 105 mEq/L (ref 96–112)
Creatinine, Ser: 0.8 mg/dL (ref 0.40–1.20)
GFR: 90.5 mL/min (ref >60.00)
Glucose, Bld: 133 mg/dL — ABNORMAL HIGH (ref 70–99)
Potassium: 4.1 mEq/L (ref 3.5–5.1)
Sodium: 140 mEq/L (ref 135–145)
Total Bilirubin: 0.4 mg/dL (ref 0.2–1.2)
Total Protein: 7 g/dL (ref 6.0–8.3)

## 2021-09-12 LAB — TSH: TSH: 2.64 u[IU]/mL (ref 0.35–5.50)

## 2021-09-12 LAB — HEPATITIS C ANTIBODY
Hepatitis C Ab: NONREACTIVE
SIGNAL TO CUT-OFF: <0.02 (ref <1.00)

## 2021-09-12 LAB — HIV ANTIBODY (ROUTINE TESTING W REFLEX): HIV 1&2 Ab, 4th Generation: NONREACTIVE

## 2021-09-12 LAB — VITAMIN B12: Vitamin B-12: 480 pg/mL (ref 211–911)

## 2021-09-16 HISTORY — PX: ELBOW SURGERY: SHX618

## 2021-09-17 ENCOUNTER — Telehealth: Payer: BC Managed Care – PPO | Admitting: Physician Assistant

## 2021-09-17 DIAGNOSIS — J019 Acute sinusitis, unspecified: Secondary | ICD-10-CM

## 2021-09-17 DIAGNOSIS — B9689 Other specified bacterial agents as the cause of diseases classified elsewhere: Secondary | ICD-10-CM | POA: Diagnosis not present

## 2021-09-17 MED ORDER — FLUCONAZOLE 150 MG PO TABS: 150.0000 mg | ORAL_TABLET | Freq: Once | ORAL | 0 refills | Status: AC

## 2021-09-17 MED ORDER — DOXYCYCLINE HYCLATE 100 MG PO TABS: 100.0000 mg | ORAL_TABLET | Freq: Two times a day (BID) | ORAL | 0 refills | Status: DC

## 2021-09-17 NOTE — Addendum Note (Signed)
Addended by: Margaretann Loveless on: 09/17/2021 01:18 PM   Modules accepted: Orders

## 2021-09-17 NOTE — Progress Notes (Signed)

## 2021-10-09 DIAGNOSIS — M25521 Pain in right elbow: Secondary | ICD-10-CM | POA: Diagnosis not present

## 2021-10-13 ENCOUNTER — Other Ambulatory Visit: Payer: Self-pay | Admitting: Podiatry

## 2021-10-13 DIAGNOSIS — G4733 Obstructive sleep apnea (adult) (pediatric): Secondary | ICD-10-CM | POA: Diagnosis not present

## 2021-10-14 ENCOUNTER — Encounter: Payer: Self-pay | Admitting: Family Medicine

## 2021-10-15 MED ORDER — TRAZODONE HCL 50 MG PO TABS: 25.0000 mg | ORAL_TABLET | Freq: Every evening | ORAL | 3 refills | Status: DC | PRN

## 2021-10-15 NOTE — Telephone Encounter (Signed)
Please advise 

## 2021-10-15 NOTE — Telephone Encounter (Signed)
See note

## 2021-10-18 DIAGNOSIS — M25521 Pain in right elbow: Secondary | ICD-10-CM | POA: Diagnosis not present

## 2021-10-22 DIAGNOSIS — M7711 Lateral epicondylitis, right elbow: Secondary | ICD-10-CM | POA: Diagnosis not present

## 2021-11-02 NOTE — Telephone Encounter (Signed)
Pt was made aware of results via mychart encounter.

## 2021-11-10 ENCOUNTER — Other Ambulatory Visit: Payer: Self-pay | Admitting: Family Medicine

## 2021-11-13 DIAGNOSIS — G4733 Obstructive sleep apnea (adult) (pediatric): Secondary | ICD-10-CM | POA: Diagnosis not present

## 2021-12-11 DIAGNOSIS — G4733 Obstructive sleep apnea (adult) (pediatric): Secondary | ICD-10-CM | POA: Diagnosis not present

## 2021-12-17 DIAGNOSIS — M25521 Pain in right elbow: Secondary | ICD-10-CM | POA: Diagnosis not present

## 2021-12-31 DIAGNOSIS — M25521 Pain in right elbow: Secondary | ICD-10-CM | POA: Diagnosis not present

## 2022-01-01 ENCOUNTER — Other Ambulatory Visit: Payer: Self-pay | Admitting: Family Medicine

## 2022-01-08 DIAGNOSIS — M25521 Pain in right elbow: Secondary | ICD-10-CM | POA: Diagnosis not present

## 2022-01-11 DIAGNOSIS — G4733 Obstructive sleep apnea (adult) (pediatric): Secondary | ICD-10-CM | POA: Diagnosis not present

## 2022-01-15 DIAGNOSIS — M25521 Pain in right elbow: Secondary | ICD-10-CM | POA: Diagnosis not present

## 2022-01-16 DIAGNOSIS — L821 Other seborrheic keratosis: Secondary | ICD-10-CM | POA: Diagnosis not present

## 2022-01-16 DIAGNOSIS — L814 Other melanin hyperpigmentation: Secondary | ICD-10-CM | POA: Diagnosis not present

## 2022-01-16 DIAGNOSIS — L578 Other skin changes due to chronic exposure to nonionizing radiation: Secondary | ICD-10-CM | POA: Diagnosis not present

## 2022-01-16 DIAGNOSIS — D1801 Hemangioma of skin and subcutaneous tissue: Secondary | ICD-10-CM | POA: Diagnosis not present

## 2022-01-17 ENCOUNTER — Encounter: Payer: Self-pay | Admitting: Family Medicine

## 2022-01-22 DIAGNOSIS — M25521 Pain in right elbow: Secondary | ICD-10-CM | POA: Diagnosis not present

## 2022-01-25 DIAGNOSIS — M25521 Pain in right elbow: Secondary | ICD-10-CM | POA: Diagnosis not present

## 2022-02-10 DIAGNOSIS — G4733 Obstructive sleep apnea (adult) (pediatric): Secondary | ICD-10-CM | POA: Diagnosis not present

## 2022-02-12 DIAGNOSIS — M25521 Pain in right elbow: Secondary | ICD-10-CM | POA: Diagnosis not present

## 2022-02-12 DIAGNOSIS — M7711 Lateral epicondylitis, right elbow: Secondary | ICD-10-CM | POA: Diagnosis not present

## 2022-02-21 DIAGNOSIS — M7711 Lateral epicondylitis, right elbow: Secondary | ICD-10-CM | POA: Diagnosis not present

## 2022-03-01 DIAGNOSIS — G8918 Other acute postprocedural pain: Secondary | ICD-10-CM | POA: Diagnosis not present

## 2022-03-01 DIAGNOSIS — M7711 Lateral epicondylitis, right elbow: Secondary | ICD-10-CM | POA: Diagnosis not present

## 2022-03-07 DIAGNOSIS — M7711 Lateral epicondylitis, right elbow: Secondary | ICD-10-CM | POA: Diagnosis not present

## 2022-03-08 DIAGNOSIS — M25521 Pain in right elbow: Secondary | ICD-10-CM | POA: Diagnosis not present

## 2022-03-13 DIAGNOSIS — G4733 Obstructive sleep apnea (adult) (pediatric): Secondary | ICD-10-CM | POA: Diagnosis not present

## 2022-03-20 DIAGNOSIS — M25521 Pain in right elbow: Secondary | ICD-10-CM | POA: Diagnosis not present

## 2022-03-25 DIAGNOSIS — M25521 Pain in right elbow: Secondary | ICD-10-CM | POA: Diagnosis not present

## 2022-04-01 DIAGNOSIS — M25521 Pain in right elbow: Secondary | ICD-10-CM | POA: Diagnosis not present

## 2022-04-15 DIAGNOSIS — M25521 Pain in right elbow: Secondary | ICD-10-CM | POA: Diagnosis not present

## 2022-06-10 ENCOUNTER — Encounter: Payer: Self-pay | Admitting: *Deleted

## 2022-07-17 ENCOUNTER — Emergency Department (HOSPITAL_BASED_OUTPATIENT_CLINIC_OR_DEPARTMENT_OTHER): Payer: BC Managed Care – PPO | Admitting: Radiology

## 2022-07-17 ENCOUNTER — Other Ambulatory Visit: Payer: Self-pay

## 2022-07-17 ENCOUNTER — Emergency Department (HOSPITAL_BASED_OUTPATIENT_CLINIC_OR_DEPARTMENT_OTHER)
Admission: EM | Admit: 2022-07-17 | Discharge: 2022-07-18 | Disposition: A | Discharge status: Home / Self Care | Payer: BC Managed Care – PPO | Attending: Emergency Medicine | Admitting: Emergency Medicine

## 2022-07-17 DIAGNOSIS — R079 Chest pain, unspecified: Secondary | ICD-10-CM | POA: Diagnosis not present

## 2022-07-17 DIAGNOSIS — R0789 Other chest pain: Secondary | ICD-10-CM | POA: Diagnosis not present

## 2022-07-17 LAB — CBC
HCT: 39 % (ref 36.0–46.0)
Hemoglobin: 13.8 g/dL (ref 12.0–15.0)
MCH: 31.5 pg (ref 26.0–34.0)
MCHC: 35.4 g/dL (ref 30.0–36.0)
MCV: 89 fL (ref 80.0–100.0)
Platelets: 310 10*3/uL (ref 150–400)
RBC: 4.38 MIL/uL (ref 3.87–5.11)
RDW: 12.3 % (ref 11.5–15.5)
WBC: 8.6 10*3/uL (ref 4.0–10.5)
nRBC: 0 % (ref 0.0–0.2)

## 2022-07-17 LAB — BASIC METABOLIC PANEL
Anion gap: 15 (ref 5–15)
BUN: 8 mg/dL (ref 6–20)
CO2: 18 mmol/L — ABNORMAL LOW (ref 22–32)
Calcium: 9.7 mg/dL (ref 8.9–10.3)
Chloride: 104 mmol/L (ref 98–111)
Creatinine, Ser: 0.82 mg/dL (ref 0.44–1.00)
GFR, Estimated: >60 mL/min (ref >60)
Glucose, Bld: 123 mg/dL — ABNORMAL HIGH (ref 70–99)
Potassium: 3.2 mmol/L — ABNORMAL LOW (ref 3.5–5.1)
Sodium: 137 mmol/L (ref 135–145)

## 2022-07-17 LAB — TROPONIN I (HIGH SENSITIVITY): Troponin I (High Sensitivity): 2 ng/L (ref <18)

## 2022-07-17 NOTE — ED Triage Notes (Signed)
POV from home with family member, pt sts that approx 1700 chest pain began while laying on the couch, pt was feeling tired before and tried to take bath, felt worse after that. Sts that she's been having anxiety and lethargy x months. Pt tachypneic and stating that hands and toes are numb. Pt alert and oriented x 4, but has very pressured speech.

## 2022-07-17 NOTE — ED Triage Notes (Signed)
Pt sts that pain radiates from under lower breast to upper breast and to her back

## 2022-07-17 NOTE — ED Notes (Signed)
Patient refused serum HCG

## 2022-07-18 LAB — D-DIMER, QUANTITATIVE: D-Dimer, Quant: <0.27 ug/mL-FEU (ref 0.00–0.50)

## 2022-07-18 LAB — TROPONIN I (HIGH SENSITIVITY): Troponin I (High Sensitivity): <2 ng/L (ref <18)

## 2022-07-18 MED ORDER — KETOROLAC TROMETHAMINE 30 MG/ML IJ SOLN
30.0000 mg | Freq: Once | INTRAMUSCULAR | Status: AC
  Administered 2022-07-18: 30 mg via INTRAVENOUS
  Filled 2022-07-18: qty 1

## 2022-07-18 NOTE — ED Notes (Signed)
Pt voiced understanding of d/c instructions. Pt was unable to sign due to signature pad not connecting.

## 2022-07-18 NOTE — ED Provider Notes (Signed)
[facility] EMERGENCY DEPT  Provider Note  CSN: [id number] Arrival date & time: 07/17/22 2141  History Chief Complaint  Patient presents with   Chest Pain    Ticia Virgo Roshell Brigham is a 46 y.o. female with prior history of chest wall pain reports onset of severe sharp L lower chest pain, below her breast around 4pm today which has been constant since that time, occasionally radiates to her back but not consistently. Worse with deep breath. No associated cough or fever. She had some recent long distance travel about 2-3 weeks ago. No leg swelling. She is concerned because her father died from MI at age 44. She does not have a personal history of CAD, HTN or DM. She was anxious about her symptoms but that has improved.    Home Medications Prior to Admission medications   Medication Sig Start Date End Date Taking? Authorizing Provider  cetirizine (ZYRTEC) 10 MG tablet Take 10 mg by mouth daily.    [provider]  doxycycline (VIBRA-TABS) 100 MG tablet Take 1 tablet (100 mg total) by mouth 2 (two) times daily. 09/17/21   Margaretann Loveless, PA-C  Iron Combinations (IRON COMPLEX PO) Take by mouth.    [provider]  meloxicam (MOBIC) 15 MG tablet TAKE 1 TABLET (15 MG TOTAL) BY MOUTH DAILY. 10/15/21   Felecia Shelling, DPM  pantoprazole (PROTONIX) 40 MG tablet TAKE 1 TABLET BY MOUTH TWICE A DAY 01/01/22   Willow Ora, MD  PARoxetine (PAXIL) 20 MG tablet TAKE 1 TABLET BY MOUTH EVERYDAY AT BEDTIME 11/12/21   Willow Ora, MD  simvastatin (ZOCOR) 20 MG tablet Take 1 tablet (20 mg total) by mouth daily. 09/05/21   Willow Ora, MD  spironolactone (ALDACTONE) 100 MG tablet Take 1 tablet (100 mg total) by mouth daily. Take with 50mg  dose for total 150mg /day 09/05/21   , MD  spironolactone (ALDACTONE) 50 MG tablet Take 1 tablet (50 mg total) by mouth daily. Take with 100mg  dose for total 150mg /day 09/05/21   Willow Ora, MD  traZODone  (DESYREL) 50 MG tablet TAKE 0.5-1 TABLETS BY MOUTH AT BEDTIME AS NEEDED FOR SLEEP. 11/12/21   , MD     Allergies    Sulfa antibiotics   Review of Systems   Review of Systems Please see HPI for pertinent positives and negatives  Physical Exam BP 110/73   Pulse 65   Resp (!) 29   Ht 5\' 6"  (1.676 m)   Wt 88.5 kg   SpO2 100%   BMI 31.47 kg/m   Physical Exam Vitals and nursing note reviewed.  Constitutional:      Appearance: Normal appearance.  HENT:     Head: Normocephalic and atraumatic.     Nose: Nose normal.     Mouth/Throat:     Mouth: Mucous membranes are moist.  Eyes:     Extraocular Movements: Extraocular movements intact.     Conjunctiva/sclera: Conjunctivae normal.  Cardiovascular:     Rate and Rhythm: Normal rate.  Pulmonary:     Effort: Pulmonary effort is normal.     Breath sounds: Normal breath sounds.  Chest:     Chest wall: Tenderness (mild, left lower chest/costal margin) present.  Abdominal:     General: Abdomen is flat.     Palpations: Abdomen is soft.     Tenderness: There is no abdominal tenderness.  Musculoskeletal:        General: No swelling. Normal  range of motion.     Cervical back: Neck supple.  Skin:    General: Skin is warm and dry.     Findings: No rash.  Neurological:     General: No focal deficit present.     Mental Status: She is alert.  Psychiatric:        Mood and Affect: Mood normal.     ED Results / Procedures / Treatments   EKG EKG Interpretation  Date/Time:  Wednesday July 17 2022 21:54:25 EDT Ventricular Rate:  94 PR Interval:  134 QRS Duration: 74 QT Interval:  372 QTC Calculation: 465 R Axis:   54 Text Interpretation: Normal sinus rhythm Nonspecific ST and T wave abnormality Abnormal ECG When compared with ECG of 09-Mar-2006 03:21, No significant change was found Confirmed by Calvert Cantor [id number]) on 07/17/2022 11:26:18 PM  Procedures Procedures  Medications Ordered in the  ED Medications  ketorolac (TORADOL) 30 MG/ML injection 30 mg (30 mg Intravenous Given 07/18/22 0018)    Initial Impression and Plan  Patient here with atypical chest pain. Concern for family history of MI. Also had recent international air travel, but vitals are reassuring. Will add dimer to eval PE. Labs done in triage show normal CBC, BMP is unremarkable. Initial trop is neg. I personally viewed the images from radiology studies and agree with radiologist interpretation: CXR is clear.    ED Course   Clinical Course as of 07/18/22 0154  Thu Jul 18, 2022  0059 Dimer is neg.  [CS]  0153 Repeat Trop remains normal. Pain improved with Toradol. Low risk for ACS or other life threatening cause of her pain. Recommend she continue with OTC pain meds, follow up with PCP and RTED for any other concerns. [CS]    Clinical Course User Index [CS] Truddie Hidden, MD     MDM Rules/Calculators/A&P Medical Decision Making Given presenting complaint, I considered that admission might be necessary. After review of results from ED lab and/or imaging studies, admission to the hospital is not indicated at this time.    Problems Addressed: Atypical chest pain: acute illness or injury  Amount and/or Complexity of Data Reviewed Labs: ordered. Decision-making details documented in ED Course. Radiology: ordered and independent interpretation performed. Decision-making details documented in ED Course. ECG/medicine tests: ordered and independent interpretation performed. Decision-making details documented in ED Course.  Risk Prescription drug management. Decision regarding hospitalization.    Final Clinical Impression(s) / ED Diagnoses Final diagnoses:  Atypical chest pain    Rx / DC Orders ED Discharge Orders     None        Truddie Hidden, MD 07/18/22 [phone]

## 2022-08-05 DIAGNOSIS — E559 Vitamin D deficiency, unspecified: Secondary | ICD-10-CM | POA: Diagnosis not present

## 2022-08-05 DIAGNOSIS — D518 Other vitamin B12 deficiency anemias: Secondary | ICD-10-CM | POA: Diagnosis not present

## 2022-08-05 DIAGNOSIS — E782 Mixed hyperlipidemia: Secondary | ICD-10-CM | POA: Diagnosis not present

## 2022-08-05 DIAGNOSIS — E038 Other specified hypothyroidism: Secondary | ICD-10-CM | POA: Diagnosis not present

## 2022-08-05 DIAGNOSIS — E119 Type 2 diabetes mellitus without complications: Secondary | ICD-10-CM | POA: Diagnosis not present

## 2022-08-05 DIAGNOSIS — K219 Gastro-esophageal reflux disease without esophagitis: Secondary | ICD-10-CM | POA: Diagnosis not present

## 2022-08-05 DIAGNOSIS — R002 Palpitations: Secondary | ICD-10-CM | POA: Diagnosis not present

## 2022-08-05 DIAGNOSIS — F419 Anxiety disorder, unspecified: Secondary | ICD-10-CM | POA: Diagnosis not present

## 2022-08-06 DIAGNOSIS — R079 Chest pain, unspecified: Secondary | ICD-10-CM | POA: Diagnosis not present

## 2022-08-06 DIAGNOSIS — R011 Cardiac murmur, unspecified: Secondary | ICD-10-CM | POA: Diagnosis not present

## 2022-08-06 DIAGNOSIS — R221 Localized swelling, mass and lump, neck: Secondary | ICD-10-CM | POA: Diagnosis not present

## 2022-08-06 DIAGNOSIS — R002 Palpitations: Secondary | ICD-10-CM | POA: Diagnosis not present

## 2022-08-06 DIAGNOSIS — E042 Nontoxic multinodular goiter: Secondary | ICD-10-CM | POA: Diagnosis not present

## 2022-08-14 ENCOUNTER — Other Ambulatory Visit: Payer: Self-pay | Admitting: Family Medicine

## 2022-08-21 ENCOUNTER — Other Ambulatory Visit: Payer: Self-pay | Admitting: Family Medicine

## 2022-08-22 ENCOUNTER — Encounter: Payer: Self-pay | Admitting: Gastroenterology

## 2022-08-29 DIAGNOSIS — R002 Palpitations: Secondary | ICD-10-CM | POA: Diagnosis not present

## 2022-08-29 DIAGNOSIS — R000 Tachycardia, unspecified: Secondary | ICD-10-CM | POA: Diagnosis not present

## 2022-09-06 ENCOUNTER — Encounter: Payer: BC Managed Care – PPO | Admitting: Family Medicine

## 2022-09-11 DIAGNOSIS — Z01419 Encounter for gynecological examination (general) (routine) without abnormal findings: Secondary | ICD-10-CM | POA: Diagnosis not present

## 2022-09-11 DIAGNOSIS — Z1231 Encounter for screening mammogram for malignant neoplasm of breast: Secondary | ICD-10-CM | POA: Diagnosis not present

## 2022-09-11 DIAGNOSIS — Z6831 Body mass index (BMI) 31.0-31.9, adult: Secondary | ICD-10-CM | POA: Diagnosis not present

## 2022-09-12 DIAGNOSIS — G4733 Obstructive sleep apnea (adult) (pediatric): Secondary | ICD-10-CM | POA: Diagnosis not present

## 2022-10-10 ENCOUNTER — Ambulatory Visit (INDEPENDENT_AMBULATORY_CARE_PROVIDER_SITE_OTHER): Payer: BC Managed Care – PPO | Admitting: Gastroenterology

## 2022-10-10 ENCOUNTER — Other Ambulatory Visit: Payer: BC Managed Care – PPO

## 2022-10-10 ENCOUNTER — Encounter: Payer: Self-pay | Admitting: Gastroenterology

## 2022-10-10 VITALS — BP 104/70 | HR 61 | Ht 66.0 in | Wt 195.0 lb

## 2022-10-10 DIAGNOSIS — K580 Irritable bowel syndrome with diarrhea: Secondary | ICD-10-CM | POA: Diagnosis not present

## 2022-10-10 DIAGNOSIS — K219 Gastro-esophageal reflux disease without esophagitis: Secondary | ICD-10-CM

## 2022-10-10 MED ORDER — DICYCLOMINE HCL 10 MG PO CAPS: 10.0000 mg | ORAL_CAPSULE | Freq: Two times a day (BID) | ORAL | 4 refills | Status: DC

## 2022-10-10 NOTE — Progress Notes (Signed)
Chief Complaint: For GI workup  Referring Provider:  Hortencia Conradi, NP      ASSESSMENT AND PLAN;   #1. Longstanding GERD on Protonix 40 BID.  #2. IBS-D, r/o other causes.  Plan: -Blood work from Dr Liz [employer]orne office. -Stool studies for GI Pathogen (includes C. Diff), fecal elastase, fat and Calprotectin -EGD/colon (w/t random colon bx) with miralax -Bentyl 10mg  po BID 1/2hrs before lunch and dinner #60.  If still with problems, increase Bentyl or trial of cholestyramine.   I discussed EGD/Colonoscopy- the indications, risks, alternatives and potential complications including, but not limited to bleeding, infection, reaction to meds, damage to internal organs, cardiac and/or pulmonary problems, and perforation requiring surgery. The possibility that significant findings could be missed was explained. All ? were answered. Pt consents to proceed. HPI:    Crystal [name] is a 47 y.o. female  H/O chest wall pain (neg 2DE @ [facility], neg cardiac workup), anxiety, HLD, OSA, chole 2004, partial hysterectomy  C/O GERD x 25 yrs with heartburn, waterbrash.  Has tried multiple medications including omeprazole, Nexium which worked initially and then stopped working.  Has been on protonix 40BID x yrs. symptoms are more with pizzas/tomato sauces/spicy foods/OJ.  No odynophagia or dysphagia.  Does complain of regurgitation.  No melena or hematochezia  C/O longstanding history of diarrhea especially after eating. 6-7/day with urgency Associated lower abdominal discomfort, bloating No consistent nocturnal symptoms No recent weight loss No fever chills or night sweats. More with stress. No history suggestive of lactose or gluten intolerance  Had "thyroid problems".  Recently had ultrasound of thyroid at [facility] which was negative per patient.  Also had extensive blood work-records are awaited.  No sodas, chocolates, chewing gums, artificial sweeteners and candy.  No NSAIDs.  No significant alcohol.  EGD/colon over 10 yrs or more years ago at [facility] - gastritis.    SH- travels a lot for her job.  She is married, global head of training.  Past Medical History:  Diagnosis Date   Allergy    Anxiety    Gall stones    GERD (gastroesophageal reflux disease)    Hyperlipidemia    Hypertension    Obesity    OSA on CPAP    Prediabetes    Sleep apnea    Thyroid disease     Past Surgical History:  Procedure Laterality Date   BILATERAL CARPAL TUNNEL RELEASE  2001   CHOLECYSTECTOMY     ELBOW SURGERY     knee surgery right  2013   PARTIAL HYSTERECTOMY  2017   menorrhagia   SHOULDER ARTHROSCOPY Right 2010, 2012   SHOULDER SURGERY Right     Family History  Problem Relation Age of Onset   Arthritis Mother    Hypertension Mother    Heart disease Father 51   Healthy Sister    Healthy Brother    Arthritis Maternal Grandmother    Kidney disease Maternal Grandmother    Colon cancer Maternal Grandfather    COPD Paternal Grandmother    Heart disease Paternal Grandfather        died in 83s   Early death Paternal Grandfather    Depression Daughter    Heart disease Paternal Uncle    Stomach cancer Neg Hx    Esophageal cancer Neg Hx     Social History   Tobacco Use   Smoking status: Never   Smokeless tobacco: Never  Vaping Use   Vaping Use: Never used  Substance Use Topics   Alcohol use: Yes    Alcohol/week: 2.0 standard drinks of alcohol    Types: 1 Glasses of wine, 1 Cans of beer per week    Comment: 1 drink a week   Drug use: Never    Current Outpatient Medications  Medication Sig Dispense Refill   cetirizine (ZYRTEC) 10 MG tablet Take 10 mg by mouth daily.     diclofenac Sodium (VOLTAREN) 1 % GEL Apply topically 4 (four) times daily. 3%     Iron Combinations (IRON COMPLEX PO) Take by mouth.     pantoprazole (PROTONIX) 40 MG tablet TAKE 1 TABLET BY MOUTH TWICE A DAY 180 tablet 3   PARoxetine (PAXIL) 20 MG tablet TAKE 1 TABLET BY  MOUTH EVERYDAY AT BEDTIME 90 tablet 3   simvastatin (ZOCOR) 20 MG tablet Take 1 tablet (20 mg total) by mouth daily. 90 tablet 3   spironolactone (ALDACTONE) 100 MG tablet TAKE 1 TABLET (100 MG TOTAL) BY MOUTH DAILY. TAKE WITH 50MG  DOSE FOR TOTAL 150MG /DAY 90 tablet 3   spironolactone (ALDACTONE) 50 MG tablet Take 1 tablet (50 mg total) by mouth daily. Take with 100mg  dose for total 150mg /day 90 tablet 3   traZODone (DESYREL) 50 MG tablet TAKE 1/2 TO 1 TABLET BY MOUTH AT BEDTIME AS NEEDED FOR SLEEP 90 tablet 2   doxycycline (VIBRA-TABS) 100 MG tablet Take 1 tablet (100 mg total) by mouth 2 (two) times daily. (Patient not taking: Reported on 10/10/2022) 20 tablet 0   No current facility-administered medications for this visit.    Allergies  Allergen Reactions   Meloxicam Dermatitis   Sulfa Antibiotics Hives    Review of Systems:  Constitutional: Denies fever, chills, diaphoresis, appetite change and fatigue.  HEENT: Has allergies Respiratory: Denies SOB, DOE, cough, chest tightness,  and wheezing.   Cardiovascular: Denies chest pain, palpitations and leg swelling.  Genitourinary: Denies dysuria, urgency, frequency, hematuria, flank pain and difficulty urinating.  Has urine leakage. Musculoskeletal: Denies myalgias, back pain, joint swelling, arthralgias and gait problem.  Skin: No rash.  Neurological: Denies dizziness, seizures, syncope, weakness, light-headedness, numbness and headaches.  Hematological: Denies adenopathy. Easy bruising, personal or family bleeding history  Psychiatric/Behavioral: Has anxiety, No depression.  Has sleeping problems.     Physical Exam:    BP 104/70   Pulse 61   Ht 5\' 6"  (1.676 m)   Wt 195 lb (88.5 kg)   BMI 31.47 kg/m  Wt Readings from Last 3 Encounters:  10/10/22 195 lb (88.5 kg)  07/17/22 195 lb (88.5 kg)  09/05/21 194 lb 12.8 oz (88.4 kg)   Constitutional:  Well-developed, in no acute distress. Psychiatric: Normal mood and affect. Behavior  is normal. Cardiovascular: Normal rate, regular rhythm. No edema Pulmonary/chest: Effort normal and breath sounds normal. No wheezing, rales or rhonchi. Abdominal: Soft, nondistended.  Mild generalized tenderness.  Bowel sounds active throughout. There are no masses palpable. No hepatomegaly. Rectal: Deferred Neurological: Alert and oriented to person place and time. Skin: Skin is warm and dry. No rashes noted.  Data Reviewed: I have personally reviewed following labs and imaging studies  CBC:    Latest Ref Rng & Units 07/17/2022   10:35 PM 09/12/2021   10:19 AM 06/27/2020   12:10 PM  CBC  WBC 4.0 - 10.5 K/uL 8.6  5.2  7.1   Hemoglobin 12.0 - 15.0 g/dL 13.8  13.3  14.5   Hematocrit 36.0 - 46.0 % 39.0  38.4  42.0   Platelets  150 - 400 K/uL 310  284.0  361     CMP:    Latest Ref Rng & Units 07/17/2022   10:35 PM 09/12/2021   10:19 AM 06/27/2020   12:10 PM  CMP  Glucose 70 - 99 mg/dL 123  133  92   BUN 6 - 20 mg/dL 8  15  11    Creatinine 0.44 - 1.00 mg/dL 0.82  0.80  0.84   Sodium 135 - 145 mmol/L 137  140  138   Potassium 3.5 - 5.1 mmol/L 3.2  4.1  4.3   Chloride 98 - 111 mmol/L 104  105  100   CO2 22 - 32 mmol/L 18  25  25    Calcium 8.9 - 10.3 mg/dL 9.7  8.9  10.4   Total Protein 6.0 - 8.3 g/dL  7.0  7.5   Total Bilirubin 0.2 - 1.2 mg/dL  0.4  0.6   Alkaline Phos 39 - 117 U/L  40    AST 0 - 37 U/L  16  19   ALT 0 - 35 U/L  24  28        Carmell Austria, MD 10/10/2022, 3:38 PM  Cc: Zoila Shutter, NP

## 2022-10-10 NOTE — Patient Instructions (Addendum)
_______________________________________________________  If your blood pressure at your visit was 140/90 or greater, please contact your primary care physician to follow up on this.  _______________________________________________________  If you are age 44 or older, your body mass index should be between 23-30. Your Body mass index is 31.47 kg/m. If this is out of the aforementioned range listed, please consider follow up with your Primary Care Provider.  If you are age 73 or younger, your body mass index should be between 19-25. Your Body mass index is 31.47 kg/m. If this is out of the aformentioned range listed, please consider follow up with your Primary Care Provider.   ________________________________________________________  The [place] [facility] providers would like to encourage you to use [facility] to communicate with providers for non-urgent requests or questions.  Due to long hold times on the telephone, sending your provider a message by [facility] may be a faster and more efficient way to get a response.  Please allow 48 business hours for a response.  Please remember that this is for non-urgent requests.  _______________________________________________________  Your provider has requested that you go to the basement level for lab work before leaving today. Press "B" on the elevator. The lab is located at the first door on the left as you exit the elevator.  Stop iron 1 week prior to procedure  You have been scheduled for an endoscopy and colonoscopy. Please follow the written instructions given to you at your visit today. Please pick up your prep supplies at the pharmacy within the next 1-3 days. If you use inhalers (even only as needed), please bring them with you on the day of your procedure.  We have sent the following medications to your pharmacy for you to pick up at your convenience: Bentyl 2 times a day 30 minutes before lunch and dinner  Thank you,  Dr. Jackquline Denmark

## 2022-10-11 ENCOUNTER — Other Ambulatory Visit: Payer: BC Managed Care – PPO

## 2022-10-11 DIAGNOSIS — K219 Gastro-esophageal reflux disease without esophagitis: Secondary | ICD-10-CM

## 2022-10-11 DIAGNOSIS — K580 Irritable bowel syndrome with diarrhea: Secondary | ICD-10-CM

## 2022-10-14 LAB — FECAL FAT, QUALITATIVE: FECAL FAT, QUALITATIVE: NORMAL

## 2022-10-14 LAB — CLOSTRIDIUM DIFFICILE TOXIN B, QUALITATIVE, REAL-TIME PCR: Toxigenic C. Difficile by PCR: NOT DETECTED

## 2022-10-15 ENCOUNTER — Telehealth: Payer: Self-pay

## 2022-10-15 ENCOUNTER — Other Ambulatory Visit: Payer: Self-pay | Admitting: Family Medicine

## 2022-10-15 LAB — GI PROFILE, STOOL, PCR

## 2022-10-15 NOTE — Telephone Encounter (Signed)
Pt made aware of recent results and Dr. Gupta recommendations: Pt verbalized understanding with all questions answered.   

## 2022-10-15 NOTE — Telephone Encounter (Signed)
I do not think norovirus is the cause of his symptoms Even if it is present, it will be self-limiting No treatment except keep well-hydrated Please let pt know RG

## 2022-10-15 NOTE — Telephone Encounter (Signed)
Received a call with a lab alert on pt:  Norovirus GI/GII Not Detected Detected Abnormal     Please advise

## 2022-10-16 LAB — CALPROTECTIN, FECAL: Calprotectin, Fecal: 17 ug/g (ref 0–120)

## 2022-10-18 LAB — PANCREATIC ELASTASE, FECAL: Pancreatic Elastase-1, Stool: >500 mcg/g

## 2022-10-22 ENCOUNTER — Telehealth: Payer: Self-pay | Admitting: Gastroenterology

## 2022-10-22 NOTE — Telephone Encounter (Signed)
Patient made aware.

## 2022-10-22 NOTE — Telephone Encounter (Signed)
Patient returning call regarding labs. Please advise.

## 2022-10-26 ENCOUNTER — Other Ambulatory Visit: Payer: Self-pay | Admitting: Gastroenterology

## 2022-11-24 ENCOUNTER — Encounter: Payer: Self-pay | Admitting: Gastroenterology

## 2022-11-26 ENCOUNTER — Encounter: Payer: Self-pay | Admitting: Gastroenterology

## 2022-11-26 ENCOUNTER — Ambulatory Visit (AMBULATORY_SURGERY_CENTER): Payer: BC Managed Care – PPO | Admitting: Gastroenterology

## 2022-11-26 VITALS — BP 121/85 | HR 67 | Temp 97.1°F | Resp 13 | Ht 66.0 in | Wt 195.0 lb

## 2022-11-26 DIAGNOSIS — K580 Irritable bowel syndrome with diarrhea: Secondary | ICD-10-CM | POA: Diagnosis not present

## 2022-11-26 DIAGNOSIS — K2100 Gastro-esophageal reflux disease with esophagitis, without bleeding: Secondary | ICD-10-CM | POA: Diagnosis not present

## 2022-11-26 DIAGNOSIS — K219 Gastro-esophageal reflux disease without esophagitis: Secondary | ICD-10-CM | POA: Diagnosis present

## 2022-11-26 HISTORY — PX: OTHER SURGICAL HISTORY: SHX169

## 2022-11-26 MED ORDER — SODIUM CHLORIDE 0.9 % IV SOLN: 500.0000 mL | Freq: Once | INTRAVENOUS | Status: DC

## 2022-11-26 NOTE — Progress Notes (Signed)
Called to room to assist during endoscopic procedure.  Patient ID and intended procedure confirmed with present staff. Received instructions for my participation in the procedure from the performing physician.  

## 2022-11-26 NOTE — Patient Instructions (Signed)
Thank you for coming in to see [place] today. Resume your diet and medications today.  You may reduce Pantoprazole (Protonix) to once daily before a meal. Return to your regular daily activities tomorrow. Recommend next screening colonoscopy in 10 years.  YOU HAD AN ENDOSCOPIC PROCEDURE TODAY AT [facility]:   Refer to the procedure report that was given to you for any specific questions about what was found during the examination.  If the procedure report does not answer your questions, please call your gastroenterologist to clarify.  If you requested that your care partner not be given the details of your procedure findings, then the procedure report has been included in a sealed envelope for you to review at your convenience later.  YOU SHOULD EXPECT: Some feelings of bloating in the abdomen. Passage of more gas than usual.  Walking can help get rid of the air that was put into your GI tract during the procedure and reduce the bloating. If you had a lower endoscopy (such as a colonoscopy or flexible sigmoidoscopy) you may notice spotting of blood in your stool or on the toilet paper. If you underwent a bowel prep for your procedure, you may not have a normal bowel movement for a few days.  Please Note:  You might notice some irritation and congestion in your nose or some drainage.  This is from the oxygen used during your procedure.  There is no need for concern and it should clear up in a day or so.  SYMPTOMS TO REPORT IMMEDIATELY:  Following lower endoscopy (colonoscopy or flexible sigmoidoscopy):  Excessive amounts of blood in the stool  Significant tenderness or worsening of abdominal pains  Swelling of the abdomen that is new, acute  Fever of 100F or higher  Following upper endoscopy (EGD)  Vomiting of blood or coffee ground material  New chest pain or pain under the shoulder blades  Painful or persistently difficult swallowing  New shortness of breath  Fever of 100F or  higher  Black, tarry-looking stools  For urgent or emergent issues, a gastroenterologist can be reached at any hour by calling [phone]. Do not use MyChart messaging for urgent concerns.    DIET:  We do recommend a small meal at first, but then you may proceed to your regular diet.  Drink plenty of fluids but you should avoid alcoholic beverages for 24 hours.  ACTIVITY:  You should plan to take it easy for the rest of today and you should NOT DRIVE or use heavy machinery until tomorrow (because of the sedation medicines used during the test).    FOLLOW UP: Our staff will call the number listed on your records the next business day following your procedure.  We will call around 7:15- 8:00 am to check on you and address any questions or concerns that you may have regarding the information given to you following your procedure. If we do not reach you, we will leave a message.     If any biopsies were taken you will be contacted by phone or by letter within the next 1-3 weeks.  Please call us at [phone] if you have not heard about the biopsies in 3 weeks.    SIGNATURES/CONFIDENTIALITY: You and/or your care partner have signed paperwork which will be entered into your electronic medical record.  These signatures attest to the fact that that the information above on your After Visit Summary has been reviewed and is understood.  Full responsibility of the  confidentiality of this discharge information lies with you and/or your care-partner.YOU HAD AN ENDOSCOPIC PROCEDURE TODAY AT [facility]:   Refer to the procedure report that was given to you for any specific questions about what was found during the examination.  If the procedure report does not answer your questions, please call your gastroenterologist to clarify.  If you requested that your care partner not be given the details of your procedure findings, then the procedure report has been included in a sealed envelope  for you to review at your convenience later.  YOU SHOULD EXPECT: Some feelings of bloating in the abdomen. Passage of more gas than usual.  Walking can help get rid of the air that was put into your GI tract during the procedure and reduce the bloating. If you had a lower endoscopy (such as a colonoscopy or flexible sigmoidoscopy) you may notice spotting of blood in your stool or on the toilet paper. If you underwent a bowel prep for your procedure, you may not have a normal bowel movement for a few days.  Please Note:  You might notice some irritation and congestion in your nose or some drainage.  This is from the oxygen used during your procedure.  There is no need for concern and it should clear up in a day or so.  SYMPTOMS TO REPORT IMMEDIATELY:  Following lower endoscopy (colonoscopy or flexible sigmoidoscopy):  Excessive amounts of blood in the stool  Significant tenderness or worsening of abdominal pains  Swelling of the abdomen that is new, acute  Fever of 100F or higher  Following upper endoscopy (EGD)  Vomiting of blood or coffee ground material  New chest pain or pain under the shoulder blades  Painful or persistently difficult swallowing  New shortness of breath  Fever of 100F or higher  Black, tarry-looking stools  For urgent or emergent issues, a gastroenterologist can be reached at any hour by calling [phone]. Do not use MyChart messaging for urgent concerns.    DIET:  We do recommend a small meal at first, but then you may proceed to your regular diet.  Drink plenty of fluids but you should avoid alcoholic beverages for 24 hours.  ACTIVITY:  You should plan to take it easy for the rest of today and you should NOT DRIVE or use heavy machinery until tomorrow (because of the sedation medicines used during the test).    FOLLOW UP: Our staff will call the number listed on your records the next business day following your procedure.  We will call around 7:15- 8:00 am  to check on you and address any questions or concerns that you may have regarding the information given to you following your procedure. If we do not reach you, we will leave a message.     If any biopsies were taken you will be contacted by phone or by letter within the next 1-3 weeks.  Please call us at [phone] if you have not heard about the biopsies in 3 weeks.    SIGNATURES/CONFIDENTIALITY: You and/or your care partner have signed paperwork which will be entered into your electronic medical record.  These signatures attest to the fact that that the information above on your After Visit Summary has been reviewed and is understood.  Full responsibility of the confidentiality of this discharge information lies with you and/or your care-partner.

## 2022-11-26 NOTE — Op Note (Signed)
[name] Patient Name: Crystal [name] Procedure Date: 11/26/2022 1:41 PM MRN: [id number] Endoscopist: Jackquline Denmark , MD, [id number] Age: 47 Referring MD:  Date of Birth: [birth year] Gender: Female Account #: [id number] Procedure:                Upper GI endoscopy Indications:              Heartburn Medicines:                Monitored Anesthesia Care Procedure:                Pre-Anesthesia Assessment:                           - Prior to the procedure, a History and Physical                            was performed, and patient medications and                            allergies were reviewed. The patient's tolerance of                            previous anesthesia was also reviewed. The risks                            and benefits of the procedure and the sedation                            options and risks were discussed with the patient.                            All questions were answered, and informed consent                            was obtained. Prior Anticoagulants: The patient has                            taken no anticoagulant or antiplatelet agents. ASA                            Grade Assessment: II - A patient with mild systemic                            disease. After reviewing the risks and benefits,                            the patient was deemed in satisfactory condition to                            undergo the procedure.                           After obtaining informed consent, the endoscope was  passed under direct vision. Throughout the                            procedure, the patient's blood pressure, pulse, and                            oxygen saturations were monitored continuously. The                            GIF [id number] was introduced through the                            mouth, and advanced to the second part of duodenum.                            The upper GI endoscopy was accomplished  without                            difficulty. The patient tolerated the procedure                            well. Scope In: Scope Out: Findings:                 The lower third of the esophagus was mildly                            tortuous but normal identified Z-line at 35 cm,                            examined by NBI. Biopsies were obtained from the                            proximal and distal esophagus with cold forceps for                            histology to r/o eosinophilic esophagitis.                           A small hiatal hernia was present.                           The entire examined stomach was normal. Biopsies                            were taken with a cold forceps for histology.                           The examined duodenum was normal. Biopsies for                            histology were taken with a cold forceps for  evaluation of celiac disease. Complications:            No immediate complications. Estimated Blood Loss:     Estimated blood loss: none. Impression:               - Small hiatal hernia.                           - Otherwise normal EGD. Recommendation:           - Patient has a contact number available for                            emergencies. The signs and symptoms of potential                            delayed complications were discussed with the                            patient. Return to normal activities tomorrow.                            Written discharge instructions were provided to the                            patient.                           - Resume previous diet.                           - Continue present medications. Can decrease                            Protonix to once a day.                           - Await pathology results.                           - Nonpharmacologic means of reflux,                           - The findings and recommendations were discussed                             with the patient's family. Jackquline Denmark, MD 11/26/2022 1:55:05 PM This report has been signed electronically.

## 2022-11-26 NOTE — Progress Notes (Signed)
Chief Complaint: For GI workup  Referring Provider:  Zoila Shutter, NP      ASSESSMENT AND PLAN;   #1. Longstanding GERD on Protonix 40 BID.  #2. IBS-D, r/o other causes.  Plan: -Blood work from Dr [employer] office. -Stool studies for GI Pathogen (includes C. Diff), fecal elastase, fat and Calprotectin- NEG  except for norovirus. -EGD/colon (w/t random colon bx) with miralax -Bentyl '10mg'$  po BID 1/2hrs before lunch and dinner #60.  If still with problems, increase Bentyl or trial of cholestyramine.   I discussed EGD/Colonoscopy- the indications, risks, alternatives and potential complications including, but not limited to bleeding, infection, reaction to meds, damage to internal organs, cardiac and/or pulmonary problems, and perforation requiring surgery. The possibility that significant findings could be missed was explained. All ? were answered. Pt consents to proceed.    for EGD/colon today. HPI:    Crystal [name] is a 47 y.o. female  H/O chest wall pain (neg 2DE @ [facility], neg cardiac workup), anxiety, HLD, OSA, chole 2004, partial hysterectomy  C/O GERD x 25 yrs with heartburn, waterbrash.  Has tried multiple medications including omeprazole, Nexium which worked initially and then stopped working.  Has been on protonix 40BID x yrs. symptoms are more with pizzas/tomato sauces/spicy foods/OJ.  No odynophagia or dysphagia.  Does complain of regurgitation.  No melena or hematochezia  C/O longstanding history of diarrhea especially after eating. 6-7/day with urgency Associated lower abdominal discomfort, bloating No consistent nocturnal symptoms No recent weight loss No fever chills or night sweats. More with stress. No history suggestive of lactose or gluten intolerance  Had "thyroid problems".  Recently had ultrasound of thyroid at [facility] which was negative per patient.  Also had extensive blood work-records are awaited.  No sodas,  chocolates, chewing gums, artificial sweeteners and candy. No NSAIDs.  No significant alcohol.  EGD/colon over 10 yrs or more years ago at [facility] - gastritis.    SH- travels a lot for her job.  She is married, global head of training.  Past Medical History:  Diagnosis Date   Allergy    Anxiety    Gall stones    GERD (gastroesophageal reflux disease)    Hyperlipidemia    Hypertension    Obesity    OSA on CPAP    Prediabetes    Sleep apnea    Thyroid disease     Past Surgical History:  Procedure Laterality Date   BILATERAL CARPAL TUNNEL RELEASE  2001   CHOLECYSTECTOMY     ELBOW SURGERY     knee surgery right  2013   PARTIAL HYSTERECTOMY  2017   menorrhagia   SHOULDER ARTHROSCOPY Right 2010, 2012   SHOULDER SURGERY Right     Family History  Problem Relation Age of Onset   Arthritis Mother    Hypertension Mother    Heart disease Father 37   Healthy Sister    Healthy Brother    Heart disease Paternal Uncle    Arthritis Maternal Grandmother    Kidney disease Maternal Grandmother    Colon cancer Maternal Grandfather    COPD Paternal Grandmother    Heart disease Paternal Grandfather        died in 43s   Early death Paternal Grandfather    Depression Daughter    Stomach cancer Neg Hx    Esophageal cancer Neg Hx    Rectal cancer Neg Hx     Social History   Tobacco Use  Smoking status: Never   Smokeless tobacco: Never  Vaping Use   Vaping Use: Never used  Substance Use Topics   Alcohol use: Yes    Alcohol/week: 2.0 standard drinks of alcohol    Types: 1 Glasses of wine, 1 Cans of beer per week    Comment: 1 drink a week   Drug use: Never    Current Outpatient Medications  Medication Sig Dispense Refill   cetirizine (ZYRTEC) 10 MG tablet Take 10 mg by mouth daily.     pantoprazole (PROTONIX) 40 MG tablet TAKE 1 TABLET BY MOUTH TWICE A DAY 180 tablet 3   PARoxetine (PAXIL) 20 MG tablet TAKE 1 TABLET BY MOUTH EVERYDAY AT BEDTIME 90 tablet 3   simvastatin  (ZOCOR) 20 MG tablet Take 1 tablet (20 mg total) by mouth daily. 90 tablet 3   spironolactone (ALDACTONE) 100 MG tablet TAKE 1 TABLET (100 MG TOTAL) BY MOUTH DAILY. TAKE WITH '50MG'$  DOSE FOR TOTAL '150MG'$ /DAY 90 tablet 3   spironolactone (ALDACTONE) 50 MG tablet Take 1 tablet (50 mg total) by mouth daily. Take with '100mg'$  dose for total '150mg'$ /day 90 tablet 3   traZODone (DESYREL) 50 MG tablet TAKE 1/2 TO 1 TABLET BY MOUTH AT BEDTIME AS NEEDED FOR SLEEP 90 tablet 2   diclofenac Sodium (VOLTAREN) 1 % GEL Apply topically 4 (four) times daily. 3%     dicyclomine (BENTYL) 10 MG capsule TAKE 1 CAPSULE BY MOUTH 2 TIMES DAILY. 180 capsule 0   Iron Combinations (IRON COMPLEX PO) Take by mouth.     Current Facility-Administered Medications  Medication Dose Route Frequency Provider Last Rate Last Admin   0.9 %  sodium chloride infusion  500 mL Intravenous Once Jackquline Denmark, MD        Allergies  Allergen Reactions   Meloxicam Dermatitis   Sulfa Antibiotics Hives    Review of Systems:  Constitutional: Denies fever, chills, diaphoresis, appetite change and fatigue.  HEENT: Has allergies Respiratory: Denies SOB, DOE, cough, chest tightness,  and wheezing.   Cardiovascular: Denies chest pain, palpitations and leg swelling.  Genitourinary: Denies dysuria, urgency, frequency, hematuria, flank pain and difficulty urinating.  Has urine leakage. Musculoskeletal: Denies myalgias, back pain, joint swelling, arthralgias and gait problem.  Skin: No rash.  Neurological: Denies dizziness, seizures, syncope, weakness, light-headedness, numbness and headaches.  Hematological: Denies adenopathy. Easy bruising, personal or family bleeding history  Psychiatric/Behavioral: Has anxiety, No depression.  Has sleeping problems.     Physical Exam:    BP 103/67   Pulse 85   Temp (!) 97.1 F (36.2 C) (Temporal)   Resp (!) 22   Ht '5\' 6"'$  (1.676 m)   Wt 195 lb (88.5 kg)   SpO2 97%   BMI 31.47 kg/m  Wt Readings from  Last 3 Encounters:  11/26/22 195 lb (88.5 kg)  10/10/22 195 lb (88.5 kg)  07/17/22 195 lb (88.5 kg)   Constitutional:  Well-developed, in no acute distress. Psychiatric: Normal mood and affect. Behavior is normal. Cardiovascular: Normal rate, regular rhythm. No edema Pulmonary/chest: Effort normal and breath sounds normal. No wheezing, rales or rhonchi. Abdominal: Soft, nondistended.  Mild generalized tenderness.  Bowel sounds active throughout. There are no masses palpable. No hepatomegaly. Rectal: Deferred Neurological: Alert and oriented to person place and time. Skin: Skin is warm and dry. No rashes noted.  Data Reviewed: I have personally reviewed following labs and imaging studies  CBC:    Latest Ref Rng & Units 07/17/2022   10:35 PM  09/12/2021   10:19 AM 06/27/2020   12:10 PM  CBC  WBC 4.0 - 10.5 K/uL 8.6  5.2  7.1   Hemoglobin 12.0 - 15.0 g/dL 13.8  13.3  14.5   Hematocrit 36.0 - 46.0 % 39.0  38.4  42.0   Platelets 150 - 400 K/uL 310  284.0  361     CMP:    Latest Ref Rng & Units 07/17/2022   10:35 PM 09/12/2021   10:19 AM 06/27/2020   12:10 PM  CMP  Glucose 70 - 99 mg/dL 123  133  92   BUN 6 - 20 mg/dL '8  15  11   '$ Creatinine 0.44 - 1.00 mg/dL 0.82  0.80  0.84   Sodium 135 - 145 mmol/L 137  140  138   Potassium 3.5 - 5.1 mmol/L 3.2  4.1  4.3   Chloride 98 - 111 mmol/L 104  105  100   CO2 22 - 32 mmol/L '18  25  25   '$ Calcium 8.9 - 10.3 mg/dL 9.7  8.9  10.4   Total Protein 6.0 - 8.3 g/dL  7.0  7.5   Total Bilirubin 0.2 - 1.2 mg/dL  0.4  0.6   Alkaline Phos 39 - 117 U/L  40    AST 0 - 37 U/L  16  19   ALT 0 - 35 U/L  24  28        Carmell Austria, MD 11/26/2022, 2:34 PM  Cc: Zoila Shutter, NP

## 2022-11-26 NOTE — Op Note (Signed)
[name] Patient Name: Crystal [name] Procedure Date: 11/26/2022 1:40 PM MRN: [id number] Endoscopist: Jackquline Denmark , MD, [id number] Age: 47 Referring MD:  Date of Birth: 07/10/1976 Gender: Female Account #: [id number] Procedure:                Colonoscopy Indications:              Chronic diarrhea. Medicines:                Monitored Anesthesia Care Procedure:                Pre-Anesthesia Assessment:                           - Prior to the procedure, a History and Physical                            was performed, and patient medications and                            allergies were reviewed. The patient's tolerance of                            previous anesthesia was also reviewed. The risks                            and benefits of the procedure and the sedation                            options and risks were discussed with the patient.                            All questions were answered, and informed consent                            was obtained. Prior Anticoagulants: The patient has                            taken no anticoagulant or antiplatelet agents. ASA                            Grade Assessment: II - A patient with mild systemic                            disease. After reviewing the risks and benefits,                            the patient was deemed in satisfactory condition to                            undergo the procedure.                           After obtaining informed consent, the colonoscope  was passed under direct vision. Throughout the                            procedure, the patient's blood pressure, pulse, and                            oxygen saturations were monitored continuously. The                            adult scope could only be passed up to mid sigmoid                            colon. The colon was "fixed". We switched to video                            pediatric colonoscope which was  passed with ease                            into terminal ileum. The Olympus [id number]-[id number]                            [id number]) Colonoscope was introduced through the                            anus and advanced to the 2 cm into the ileum. The                            colonoscopy was performed without difficulty. The                            patient tolerated the procedure well. The quality                            of the bowel preparation was good. The terminal                            ileum, ileocecal valve, appendiceal orifice, and                            rectum were photographed. Scope In: 1:56:44 PM Scope Out: 2:26:34 PM Scope Withdrawal Time: 0 hours 13 minutes 32 seconds  Total Procedure Duration: 0 hours 29 minutes 50 seconds  Findings:                 The colon (entire examined portion) appeared                            normal. Biopsies for histology were taken with a                            cold forceps from the entire colon for evaluation                            of  microscopic colitis.                           Multiple medium-mouthed diverticula were found in                            the sigmoid colon and few in descending colon. It                            would give sigmoid colon a "Swiss cheese                            appearance". There was luminal narrowing consistent                            with muscular hypertrophy. The sigmoid colon was to                            some extent "fixed".                           The terminal ileum appeared normal.                           Non-bleeding internal hemorrhoids were found during                            retroflexion. The hemorrhoids were small and Grade                            I (internal hemorrhoids that do not prolapse).                           The exam was otherwise without abnormality on                            direct and retroflexion views. Complications:            No immediate  complications. Estimated Blood Loss:     Estimated blood loss: none. Impression:               - Moderate predominantly sigmoid diverticulosis.                           - Otherwise normal colonoscopy to TI. Recommendation:           - Patient has a contact number available for                            emergencies. The signs and symptoms of potential                            delayed complications were discussed with the                            patient. Return to normal activities tomorrow.  Written discharge instructions were provided to the                            patient.                           - Resume previous diet.                           - Continue present medications.                           - Await pathology results.                           - Repeat colonoscopy in 10 years for screening                            purposes. Earlier, if with any new problems or                            change in family history.                           - The findings and recommendations were discussed                            with the patient's family. Jackquline Denmark, MD 11/26/2022 2:32:06 PM This report has been signed electronically.

## 2022-11-26 NOTE — Progress Notes (Signed)
Uneventful anesthetic. Report to pacu rn. Vss. Care resumed by rn. 

## 2022-11-27 ENCOUNTER — Telehealth: Payer: Self-pay

## 2022-11-27 NOTE — Telephone Encounter (Signed)
Left message on answering machine. 

## 2022-12-07 ENCOUNTER — Encounter: Payer: Self-pay | Admitting: Gastroenterology

## 2022-12-17 ENCOUNTER — Other Ambulatory Visit: Payer: Self-pay | Admitting: Urology

## 2022-12-23 ENCOUNTER — Encounter (HOSPITAL_BASED_OUTPATIENT_CLINIC_OR_DEPARTMENT_OTHER): Payer: Self-pay | Admitting: Urology

## 2022-12-23 ENCOUNTER — Other Ambulatory Visit: Payer: Self-pay

## 2022-12-23 NOTE — Progress Notes (Signed)
Spoke w/ via phone for pre-op interview---Crystal [name] needs dos----  I stat             [name] results------EKG 07-17-2022 with chart COVID test -----patient states asymptomatic no test needed Arrive at -------715 am 01-11-2023 NPO after MN NO Solid Food.  Clear liquids from MN until---615 am Med rec completed Medications to take morning of surgery -----none Diabetic medication -----n/a Patient instructed no nail polish to be worn day of surgery Patient instructed to bring photo id and insurance card day of surgery Patient aware to have Driver (ride ) / caregiver  wife Crystal [name] will stay  for 24 hours after surgery  Patient Special Instructions -----bring cpap mask tubing and machine Pre-Op special Istructions -----none Patient verbalized understanding of instructions that were given at this phone interview. Patient denies shortness of breath, chest pain, fever, cough at this phone interview.

## 2022-12-30 NOTE — Anesthesia Preprocedure Evaluation (Signed)
Anesthesia Evaluation  Patient identified by MRN, date of birth, ID band Patient awake    Reviewed: Allergy & Precautions, NPO status , Patient's Chart, lab work & pertinent test results  History of Anesthesia Complications Negative for: history of anesthetic complications  Airway Mallampati: II   Neck ROM: Full    Dental no notable dental hx. (+) Dental Advisory Given   Pulmonary sleep apnea and Continuous Positive Airway Pressure Ventilation    Pulmonary exam normal        Cardiovascular negative cardio ROS Normal cardiovascular exam     Neuro/Psych  PSYCHIATRIC DISORDERS Anxiety     negative neurological ROS     GI/Hepatic Neg liver ROS,GERD  Medicated,,  Endo/Other  negative endocrine ROS    Renal/GU negative Renal ROS     Musculoskeletal negative musculoskeletal ROS (+)    Abdominal   Peds  Hematology negative hematology ROS (+)   Anesthesia Other Findings   Reproductive/Obstetrics                             Anesthesia Physical Anesthesia Plan  ASA: 2  Anesthesia Plan: General   Post-op Pain Management: Tylenol PO (pre-op)* and Toradol IV (intra-op)*   Induction: Intravenous  PONV Risk Score and Plan: 4 or greater and Midazolam, Ondansetron, Dexamethasone and Scopolamine patch - Pre-op  Airway Management Planned: LMA  Additional Equipment:   Intra-op Plan:   Post-operative Plan: Extubation in OR  Informed Consent: I have reviewed the patients History and Physical, chart, labs and discussed the procedure including the risks, benefits and alternatives for the proposed anesthesia with the patient or authorized representative who has indicated his/her understanding and acceptance.     Dental advisory given  Plan Discussed with: Anesthesiologist and CRNA  Anesthesia Plan Comments:        Anesthesia Quick Evaluation

## 2022-12-31 ENCOUNTER — Encounter (HOSPITAL_BASED_OUTPATIENT_CLINIC_OR_DEPARTMENT_OTHER): Payer: Self-pay | Admitting: Urology

## 2022-12-31 ENCOUNTER — Encounter (HOSPITAL_BASED_OUTPATIENT_CLINIC_OR_DEPARTMENT_OTHER)
Admission: RE | Disposition: A | Discharge status: Home / Self Care | Payer: Self-pay | Source: Home / Self Care | Attending: Urology

## 2022-12-31 ENCOUNTER — Other Ambulatory Visit: Payer: Self-pay

## 2022-12-31 ENCOUNTER — Ambulatory Visit (HOSPITAL_BASED_OUTPATIENT_CLINIC_OR_DEPARTMENT_OTHER): Payer: BC Managed Care – PPO | Admitting: Anesthesiology

## 2022-12-31 ENCOUNTER — Ambulatory Visit (HOSPITAL_BASED_OUTPATIENT_CLINIC_OR_DEPARTMENT_OTHER)
Admission: RE | Admit: 2022-12-31 | Discharge: 2022-12-31 | Disposition: A | Discharge status: Home / Self Care | Payer: BC Managed Care – PPO | Attending: Urology | Admitting: Urology

## 2022-12-31 DIAGNOSIS — F419 Anxiety disorder, unspecified: Secondary | ICD-10-CM | POA: Diagnosis not present

## 2022-12-31 DIAGNOSIS — G4730 Sleep apnea, unspecified: Secondary | ICD-10-CM | POA: Diagnosis not present

## 2022-12-31 DIAGNOSIS — N393 Stress incontinence (female) (male): Secondary | ICD-10-CM | POA: Diagnosis present

## 2022-12-31 DIAGNOSIS — Z01818 Encounter for other preprocedural examination: Secondary | ICD-10-CM

## 2022-12-31 DIAGNOSIS — K219 Gastro-esophageal reflux disease without esophagitis: Secondary | ICD-10-CM | POA: Insufficient documentation

## 2022-12-31 HISTORY — DX: Personal history of other endocrine, nutritional and metabolic disease: Z86.39

## 2022-12-31 HISTORY — PX: CYSTOSCOPY WITH INJECTION: SHX1424

## 2022-12-31 HISTORY — DX: Presence of spectacles and contact lenses: Z97.3

## 2022-12-31 HISTORY — DX: Personal history of other specified conditions: Z87.898

## 2022-12-31 HISTORY — DX: Acne, unspecified: L70.9

## 2022-12-31 LAB — POCT I-STAT, CHEM 8
BUN: 10 mg/dL (ref 6–20)
Calcium, Ion: 1.23 mmol/L (ref 1.15–1.40)
Chloride: 102 mmol/L (ref 98–111)
Creatinine, Ser: 0.7 mg/dL (ref 0.44–1.00)
Glucose, Bld: 126 mg/dL — ABNORMAL HIGH (ref 70–99)
HCT: 39 % (ref 36.0–46.0)
Hemoglobin: 13.3 g/dL (ref 12.0–15.0)
Potassium: 4.2 mmol/L (ref 3.5–5.1)
Sodium: 140 mmol/L (ref 135–145)
TCO2: 27 mmol/L (ref 22–32)

## 2022-12-31 SURGERY — CYSTOSCOPY, WITH INJECTION OF BLADDER NECK OR BLADDER WALL
Anesthesia: General | Site: Urethra

## 2022-12-31 MED ORDER — SCOPOLAMINE 1 MG/3DAYS TD PT72
MEDICATED_PATCH | TRANSDERMAL | Status: AC
  Filled 2022-12-31: qty 1

## 2022-12-31 MED ORDER — PROPOFOL 10 MG/ML IV BOLUS
INTRAVENOUS | Status: AC
  Filled 2022-12-31: qty 20

## 2022-12-31 MED ORDER — FENTANYL CITRATE (PF) 100 MCG/2ML IJ SOLN
INTRAMUSCULAR | Status: AC
  Filled 2022-12-31: qty 2

## 2022-12-31 MED ORDER — KETOROLAC TROMETHAMINE 30 MG/ML IJ SOLN
INTRAMUSCULAR | Status: AC
  Filled 2022-12-31: qty 1

## 2022-12-31 MED ORDER — FENTANYL CITRATE (PF) 100 MCG/2ML IJ SOLN
INTRAMUSCULAR | Status: DC | PRN
  Administered 2022-12-31: 100 ug via INTRAVENOUS

## 2022-12-31 MED ORDER — AMISULPRIDE (ANTIEMETIC) 5 MG/2ML IV SOLN: 10.0000 mg | Freq: Once | INTRAVENOUS | Status: DC | PRN

## 2022-12-31 MED ORDER — PROMETHAZINE HCL 25 MG/ML IJ SOLN: 6.2500 mg | INTRAMUSCULAR | Status: DC | PRN

## 2022-12-31 MED ORDER — SCOPOLAMINE 1 MG/3DAYS TD PT72
1.0000 | MEDICATED_PATCH | TRANSDERMAL | Status: DC
  Administered 2022-12-31: 1.5 mg via TRANSDERMAL

## 2022-12-31 MED ORDER — CEFAZOLIN SODIUM-DEXTROSE 2-4 GM/100ML-% IV SOLN
INTRAVENOUS | Status: AC
  Filled 2022-12-31: qty 100

## 2022-12-31 MED ORDER — ACETAMINOPHEN 500 MG PO TABS
ORAL_TABLET | ORAL | Status: AC
  Filled 2022-12-31: qty 2

## 2022-12-31 MED ORDER — CEFAZOLIN SODIUM-DEXTROSE 2-4 GM/100ML-% IV SOLN
2.0000 g | INTRAVENOUS | Status: AC
  Administered 2022-12-31: 2 g via INTRAVENOUS

## 2022-12-31 MED ORDER — PROPOFOL 10 MG/ML IV BOLUS
INTRAVENOUS | Status: DC | PRN
  Administered 2022-12-31: 170 mg via INTRAVENOUS

## 2022-12-31 MED ORDER — LIDOCAINE 2% (20 MG/ML) 5 ML SYRINGE
INTRAMUSCULAR | Status: DC | PRN
  Administered 2022-12-31: 100 mg via INTRAVENOUS

## 2022-12-31 MED ORDER — FENTANYL CITRATE (PF) 100 MCG/2ML IJ SOLN: 25.0000 ug | INTRAMUSCULAR | Status: DC | PRN

## 2022-12-31 MED ORDER — LACTATED RINGERS IV SOLN: INTRAVENOUS | Status: DC

## 2022-12-31 MED ORDER — MIDAZOLAM HCL 2 MG/2ML IJ SOLN
INTRAMUSCULAR | Status: AC
  Filled 2022-12-31: qty 2

## 2022-12-31 MED ORDER — WATER FOR IRRIGATION, STERILE IR SOLN
Status: DC | PRN
  Administered 2022-12-31: 3000 mL

## 2022-12-31 MED ORDER — ONDANSETRON HCL 4 MG/2ML IJ SOLN
INTRAMUSCULAR | Status: DC | PRN
  Administered 2022-12-31: 4 mg via INTRAVENOUS

## 2022-12-31 MED ORDER — DEXAMETHASONE SODIUM PHOSPHATE 10 MG/ML IJ SOLN
INTRAMUSCULAR | Status: DC | PRN
  Administered 2022-12-31: 10 mg via INTRAVENOUS

## 2022-12-31 MED ORDER — DEXAMETHASONE SODIUM PHOSPHATE 10 MG/ML IJ SOLN
INTRAMUSCULAR | Status: AC
  Filled 2022-12-31: qty 2

## 2022-12-31 MED ORDER — ONDANSETRON HCL 4 MG/2ML IJ SOLN
INTRAMUSCULAR | Status: AC
  Filled 2022-12-31: qty 4

## 2022-12-31 MED ORDER — ACETAMINOPHEN 500 MG PO TABS
1000.0000 mg | ORAL_TABLET | Freq: Once | ORAL | Status: AC
  Administered 2022-12-31: 1000 mg via ORAL

## 2022-12-31 MED ORDER — MIDAZOLAM HCL 2 MG/2ML IJ SOLN
INTRAMUSCULAR | Status: DC | PRN
  Administered 2022-12-31: 2 mg via INTRAVENOUS

## 2022-12-31 MED ORDER — LIDOCAINE HCL (PF) 2 % IJ SOLN
INTRAMUSCULAR | Status: AC
  Filled 2022-12-31: qty 10

## 2022-12-31 SURGICAL SUPPLY — 22 items
BAG DRAIN URO-CYSTO SKYTR STRL (DRAIN) ×1 IMPLANT
BAG DRN UROCATH (DRAIN) ×1
CLOTH BEACON ORANGE TIMEOUT ST (SAFETY) ×1 IMPLANT
ELECT REM PT RETURN 9FT ADLT (ELECTROSURGICAL)
ELECTRODE REM PT RTRN 9FT ADLT (ELECTROSURGICAL) ×1 IMPLANT
GLOVE BIO SURGEON STRL SZ 6.5 (GLOVE) ×1 IMPLANT
GLOVE BIOGEL PI IND STRL 6 (GLOVE) IMPLANT
GLOVE ECLIPSE 6.5 STRL STRAW (GLOVE) IMPLANT
GOWN STRL REUS W/TWL LRG LVL3 (GOWN DISPOSABLE) ×1 IMPLANT
KIT TURNOVER CYSTO (KITS) ×1 IMPLANT
MANIFOLD NEPTUNE II (INSTRUMENTS) ×1 IMPLANT
NDL ASPIRATION 22 (NEEDLE) ×1 IMPLANT
NDL SAFETY ECLIP 18X1.5 (MISCELLANEOUS) ×1 IMPLANT
NEEDLE ASPIRATION 22 (NEEDLE) IMPLANT
PACK CYSTO (CUSTOM PROCEDURE TRAY) ×1 IMPLANT
SLEEVE SCD COMPRESS KNEE MED (STOCKING) ×1 IMPLANT
SYR 20ML LL LF (SYRINGE) ×1 IMPLANT
SYR CONTROL 10ML LL (SYRINGE) ×1 IMPLANT
SYSTEM URETHRAL BULK BULKAMID (Female Continence) IMPLANT
TUBE CONNECTING 12X1/4 (SUCTIONS) ×1 IMPLANT
TUBING UROLOGY SET (TUBING) ×1 IMPLANT
WATER STERILE IRR 3000ML UROMA (IV SOLUTION) ×1 IMPLANT

## 2022-12-31 NOTE — Discharge Instructions (Addendum)
Cystoscopy with [employer] patient instructions  Following a cystoscopy, a catheter (a flexible rubber tube) is sometimes left in place to empty the bladder. This may cause some discomfort or a feeling that you need to urinate. Your doctor determines the period of time that the catheter will be left in place. You may have bloody urine for two to three days (Call your doctor if the amount of bleeding increases or does not subside).  You may pass blood clots in your urine, especially if you had a biopsy. It is not unusual to pass small blood clots and have some bloody urine a couple of weeks after your cystoscopy. Again, call your doctor if the bleeding does not subside. You may have: Dysuria (painful urination) Frequency (urinating often) Urgency (strong desire to urinate)  These symptoms are common especially if medicine is instilled into the bladder or a ureteral stent is placed. Avoiding alcohol and caffeine, such as coffee, tea, and chocolate, may help relieve these symptoms. Drink plenty of water, unless otherwise instructed. Your doctor may also prescribe an antibiotic or other medicine to reduce these symptoms.  Cystoscopy results are available soon after the procedure; biopsy results usually take two to four days. Your doctor will discuss the results of your exam with you. Before you go home, you will be given specific instructions for follow-up care. Special Instructions:   If you are going home with a catheter in place do not take a tub bath until removed by your doctor.   You may resume your normal activities.   Do not drive or operate machinery if you are taking narcotic pain medicine.   Be sure to keep all follow-up appointments with your doctor.   Call Your Doctor If: The catheter is not draining You have severe pain You are unable to urinate You have a fever over 101 You have severe bleeding Medications: -You can use AZO over the counter for burning with urination. -Tylenol  and ibuprofen can be used for pain            No acetaminophen/Tylenol until after 2:00pm today if needed for pain.     Post Anesthesia Home Care Instructions  Activity: Get plenty of rest for the remainder of the day. A responsible individual must stay with you for 24 hours following the procedure.  For the next 24 hours, DO NOT: -Drive a car -Advertising copywriter -Drink alcoholic beverages -Take any medication unless instructed by your physician -Make any legal decisions or sign important papers.  Meals: Start with liquid foods such as gelatin or soup. Progress to regular foods as tolerated. Avoid greasy, spicy, heavy foods. If nausea and/or vomiting occur, drink only clear liquids until the nausea and/or vomiting subsides. Call your physician if vomiting continues.  Special Instructions/Symptoms: Your throat may feel dry or sore from the anesthesia or the breathing tube placed in your throat during surgery. If this causes discomfort, gargle with warm salt water. The discomfort should disappear within 24 hours.  If you had a scopolamine patch placed behind your ear for the management of post- operative nausea and/or vomiting:  1. The medication in the patch is effective for 72 hours, after which it should be removed.  Wrap patch in a tissue and discard in the trash. Wash hands thoroughly with soap and water. 2. You may remove the patch earlier than 72 hours if you experience unpleasant side effects which may include dry mouth, dizziness or visual disturbances. 3. Avoid touching the patch. Wash your hands  with soap and water after contact with the patch.

## 2022-12-31 NOTE — Anesthesia Procedure Notes (Signed)
Procedure Name: LMA Insertion Date/Time: 12/31/2022 8:47 AM  Performed by: Bishop Limbo, CRNAPre-anesthesia Checklist: Patient identified, Emergency Drugs available, Suction available and Patient being monitored Patient Re-evaluated:Patient Re-evaluated prior to induction Oxygen Delivery Method: Circle System Utilized Preoxygenation: Pre-oxygenation with 100% oxygen Induction Type: IV induction Ventilation: Mask ventilation without difficulty LMA: LMA inserted LMA Size: 4.0 Number of attempts: 1 Placement Confirmation: positive ETCO2 Tube secured with: Tape Dental Injury: Teeth and Oropharynx as per pre-operative assessment

## 2022-12-31 NOTE — Anesthesia Postprocedure Evaluation (Signed)
Anesthesia Post Note  Patient: Crystal [name]  Procedure(s) Performed: CYSTOSCOPY WITH BULKAMID INJECTION (Urethra)     Patient location during evaluation: PACU Anesthesia Type: General Level of consciousness: sedated Pain management: pain level controlled Vital Signs Assessment: post-procedure vital signs reviewed and stable Respiratory status: spontaneous breathing and respiratory function stable Cardiovascular status: stable Postop Assessment: no apparent nausea or vomiting Anesthetic complications: no   No notable events documented.  Last Vitals:  Vitals:   12/31/22 1000 12/31/22 1044  BP: 104/73 117/73  Pulse: 73 71  Resp: 10 13  Temp:  36.6 C  SpO2: 94% 94%    Last Pain:  Vitals:   12/31/22 1044  TempSrc:   PainSc: 0-No pain                 Coy Vandoren DANIEL

## 2022-12-31 NOTE — Interval H&P Note (Signed)
History and Physical Interval Note:  12/31/2022 8:33 AM  Crystal [name]  has presented today for surgery, with the diagnosis of STRESS URINARY INCONTINENCE.  The various methods of treatment have been discussed with the patient and family. After consideration of risks, benefits and other options for treatment, the patient has consented to  Procedure(s) with comments: CYSTOSCOPY WITH BULKAMID INJECTION (N/A) - 45 MINUTES as a surgical intervention.  The patient's history has been reviewed, patient examined, no change in status, stable for surgery.  I have reviewed the patient's chart and labs.  Questions were answered to the patient's satisfaction.     Bralyn Espino D Waqas Bruhl

## 2022-12-31 NOTE — Transfer of Care (Signed)
Immediate Anesthesia Transfer of Care Note  Patient: Crystal [name]  Procedure(s) Performed: CYSTOSCOPY WITH BULKAMID INJECTION (Urethra)  Patient Location: PACU  Anesthesia Type:General  Level of Consciousness: drowsy  Airway & Oxygen Therapy: Patient Spontanous Breathing and Patient connected to nasal cannula oxygen  Post-op Assessment: Report given to RN and Post -op Vital signs reviewed and stable  Post vital signs: Reviewed and stable  Last Vitals:  Vitals Value Taken Time  BP 105/67 12/31/22 0915  Temp 36.1 C 12/31/22 0915  Pulse 73 12/31/22 0918  Resp 13 12/31/22 0918  SpO2 98 % 12/31/22 0918  Vitals shown include unvalidated device data.  Last Pain:  Vitals:   12/31/22 0915  TempSrc:   PainSc: Asleep      Patients Stated Pain Goal: 3 (12/31/22 0759)  Complications: No notable events documented.

## 2022-12-31 NOTE — H&P (Signed)
CC/HPI: cc: SUI   10/09/22: 47 year old woman comes in with worsening stress urinary incontinence. She has to wear an incontinence pad daily. Anytime she moves, changes position goes from sitting to standing she will have leakage. She denies any urgency or urge incontinence. She has a history of 1 pregnancy and labor and delivery. This was complicated by preterm labor and she was on bedrest for 2 months. Her child is now 73 years old. She has discussed pelvic floor physical therapy with her gynecologist, Dr. Langston Masker, however patient is not interested in doing this. She has done [employer] on her own at home. She travels globally and is tired of having to wear an incontinence pad all the time.   12/11/2022: 47 year old woman with symptomatic stress urinary incontinence here for cystoscopy and pelvic exam.     ALLERGIES: Sulfa    MEDICATIONS: Simvastatin 20 mg tablet  Paxil 40 mg tablet  Protonix 40 mg tablet, delayed release  Retinol  Spironolactone  Voltaren Arthritis Pain 1 % gel     GU PSH: No GU PSH      PSH Notes: elbow- right (2023), partial hysterectomy (2017), gallbladder (2004)   NON-GU PSH: Carpal tunnel surgery, 2000 Shoulder Arthroscopy/surgery, Right, 2010/2012     GU PMH: Stress Incontinence - 10/09/2022    NON-GU PMH: Anxiety GERD Hypercholesterolemia Sleep Apnea    FAMILY HISTORY: 1 Daughter - Daughter heart failure - Father   SOCIAL HISTORY: Marital Status: Married Preferred Language: English; Ethnicity: Not Hispanic Or Latino; Race: White Current Smoking Status: Patient has never smoked.   Tobacco Use Assessment Completed: Used Tobacco in last 30 days? Social Drinker.  Drinks 2 caffeinated drinks per day. Patient's occupation Chiropodist.    REVIEW OF SYSTEMS:    GU Review Female:   Patient denies frequent urination, hard to postpone urination, burning /pain with urination, get up at night to urinate, leakage of urine, stream starts and  stops, trouble starting your stream, have to strain to urinate, and being pregnant.  Gastrointestinal (Upper):   Patient denies nausea, vomiting, and indigestion/ heartburn.  Gastrointestinal (Lower):   Patient denies diarrhea and constipation.  Constitutional:   Patient denies fever, night sweats, weight loss, and fatigue.  Skin:   Patient denies skin rash/ lesion and itching.  Eyes:   Patient denies blurred vision and double vision.  Ears/ Nose/ Throat:   Patient denies sore throat and sinus problems.  Hematologic/Lymphatic:   Patient denies swollen glands and easy bruising.  Cardiovascular:   Patient denies leg swelling and chest pains.  Respiratory:   Patient denies cough and shortness of breath.  Endocrine:   Patient denies excessive thirst.  Musculoskeletal:   Patient denies back pain and joint pain.  Neurological:   Patient denies headaches and dizziness.  Psychologic:   Patient denies depression and anxiety.   VITAL SIGNS:      12/11/2022 08:31 AM  BP 116/81 mmHg  Pulse 81 /min   GU PHYSICAL EXAMINATION:    External Genitalia: No hirsutism, no rash, no scarring, no cyst, no erythematous lesion, no papular lesion, no blanched lesion, no warty lesion. No edema.  Urethral Meatus: Normal size. Normal position. No discharge.  Urethra: No tenderness, no mass, no scarring. No hypermobility. No leakage.  Bladder: Normal to palpation, no tenderness, no mass, normal size.  Vagina: No atrophy, no stenosis. No rectocele. Grade 2 anterior/apical wall prolapse with valsalva.   MULTI-SYSTEM PHYSICAL EXAMINATION:    Constitutional: Well-nourished. No physical deformities. Normally  developed. Good grooming.  Neck: Neck symmetrical, not swollen. Normal tracheal position.  Respiratory: No labored breathing, no use of accessory muscles.   Skin: No paleness, no jaundice, no cyanosis. No lesion, no ulcer, no rash.  Neurologic / Psychiatric: Oriented to time, oriented to place, oriented to person.  No depression, no anxiety, no agitation.  Eyes: Normal conjunctivae. Normal eyelids.  Ears, Nose, Mouth, and Throat: Left ear no scars, no lesions, no masses. Right ear no scars, no lesions, no masses. Nose no scars, no lesions, no masses. Normal hearing. Normal lips.  Musculoskeletal: Normal gait and station of head and neck.     Complexity of Data:  Records Review:   Previous Patient Records, POC Tool  Urine Test Review:   Urinalysis  Notes:                     07/17/2022: BUN 8, creatinine 0.82   PROCEDURES:         Flexible Cystoscopy - 52000  Risks, benefits, and some of the potential complications of the procedure were discussed at length with the patient including infection, bleeding, voiding discomfort, urinary retention, fever, chills, sepsis, and others. All questions were answered. Informed consent was obtained. Antibiotic prophylaxis was given. Sterile technique and intraurethral analgesia were used.  Meatus:  Normal size. Normal location. Normal condition.  Urethra:  No hypermobility. +leakage with cough in dorsal lithotomy  Ureteral Orifices:  Normal location. Normal size. Normal shape. Effluxed clear urine.  Bladder:  No trabeculation. No tumors. Normal mucosa. No stones.      The lower urinary tract was carefully examined. The procedure was well-tolerated and without complications. Antibiotic instructions were given. Instructions were given to call the office immediately for bloody urine, difficulty urinating, urinary retention, painful or frequent urination, fever, chills, nausea, vomiting or other illness. The patient stated that she understood these instructions and would comply with them.         Urinalysis w/Scope Dipstick Dipstick Cont'd Micro  Color: Yellow Bilirubin: Neg mg/dL WBC/hpf: NS (Not Seen)  Appearance: Slightly Cloudy Ketones: Trace mg/dL RBC/hpf: NS (Not Seen)  Specific Gravity: 1.025 Blood: Neg ery/uL Bacteria: NS (Not Seen)  pH: <=5.0 Protein: Neg mg/dL  Cystals: Amorph Urates  Glucose: Neg mg/dL Urobilinogen: 0.2 mg/dL Casts: NS (Not Seen)    Nitrites: Neg Trichomonas: Not Present    Leukocyte Esterase: Neg leu/uL Mucous: Not Present      Epithelial Cells: 10 - 20/hpf      Yeast: NS (Not Seen)      Sperm: Not Present    ASSESSMENT:      ICD-10 Details  1 GU:   Stress Incontinence - N39.3 Chronic, Stable   PLAN:           Document Letter(s):  Created for Patient: Clinical Summary         Notes:   Stress urinary continence:  -Patient with demonstrable stress urinary continence on physical exam. Cystoscopy otherwise normal and pelvic exam shows grade 2 prolapse. Patient is not bothered by her prolapse. We discussed management of stress incontinence in detail including Bulkamid versus mid urethral sling.  -Risks and benefits of Bulkamid were discussed with the patient in detail including but not limited to pain, bleeding, transient urinary retention, failure of procedure to improve symptoms, need for additional procedures, recurrence. We also discussed the risks and benefits of a sling however after reviewing both options we will move forward with Bulkamid.

## 2022-12-31 NOTE — Op Note (Addendum)
Operative Note   Preoperative diagnosis:  1.  Stress urinary incontinence   Postoperative diagnosis: 1.  Stress urinary incontinence   Procedure(s): 1.  Cystoscopy with injection of bulkamid 2.  Urethral dilation 20Fr to 24Fr with sounds   Surgeon: Kasandra Knudsen, MD   Assistants:  None   Anesthesia:  General   Complications:  None   EBL:  minimal   Specimens: 1. none   Drains/Catheters: 1.  none   Intraoperative findings:   Normal urethra   Indication:  47 yo woman with symptomatic stress urinary incontinence.   Description of procedure:   After risks and benefits of the procedure discussed with the patient, informed consent was obtained.  The patient was taken to the operating placed in the supine position.  Anesthesia was induced and antibiotics were administered.  The patient was then repositioned in the dorsolithotomy position.  She was prepped and draped in usual sterile fashion a timeout performed with the attending present.  Patient's urethra was gently dilated with female sounds from 18Fr to 24Fr to allow for cystoscope to pass.The cystoscope was assembled with the Bulkamid system.  It was then placed in the urethral meatus and advanced into the bladder under direct visualization.  Prior cystoscopy had been done which noted normal anatomic landmarks.  These were again verified during cystoscopy today.  The cystoscope was brought back to the bladder neck and the needle was advanced through the needle guide at the 1 o'clock position.  Once it was visualized and advanced it was rotated to the 5 o'clock position.  Bulkamid was then injected until blood was seen.  This was then repeated at the 1 o'clock position in the 7 o'clock position until coaptation was noted.   This concluded the case.  The patient's bladder was left with approximately 200 cc of sterile saline.  The patient emerged from anesthesia and was transferred the PACU in stable condition.   Plan:  Plan  for patient to void in PACU prior to discharge.

## 2023-01-01 ENCOUNTER — Encounter (HOSPITAL_BASED_OUTPATIENT_CLINIC_OR_DEPARTMENT_OTHER): Payer: Self-pay | Admitting: Urology

## 2023-06-03 ENCOUNTER — Other Ambulatory Visit: Payer: Self-pay | Admitting: Family Medicine
